# Patient Record
Sex: Female | Born: 1940 | Race: Black or African American | Hispanic: No | Marital: Married | Smoking: Never smoker
Health system: Southern US, Community
[De-identification: ages and names within clinical notes are randomized; demographics above are authoritative.]

## PROBLEM LIST (undated history)

## (undated) DIAGNOSIS — R0602 Shortness of breath: Secondary | ICD-10-CM

## (undated) DIAGNOSIS — J84113 Idiopathic non-specific interstitial pneumonitis: Secondary | ICD-10-CM

## (undated) DIAGNOSIS — Z6841 Body Mass Index (BMI) 40.0 and over, adult: Secondary | ICD-10-CM

## (undated) DIAGNOSIS — Z1211 Encounter for screening for malignant neoplasm of colon: Secondary | ICD-10-CM

## (undated) DIAGNOSIS — E785 Hyperlipidemia, unspecified: Secondary | ICD-10-CM

## (undated) DIAGNOSIS — I2699 Other pulmonary embolism without acute cor pulmonale: Secondary | ICD-10-CM

## (undated) DIAGNOSIS — I10 Essential (primary) hypertension: Secondary | ICD-10-CM

## (undated) DIAGNOSIS — M1990 Unspecified osteoarthritis, unspecified site: Secondary | ICD-10-CM

## (undated) DIAGNOSIS — Z79899 Other long term (current) drug therapy: Secondary | ICD-10-CM

## (undated) DIAGNOSIS — I890 Lymphedema, not elsewhere classified: Secondary | ICD-10-CM

## (undated) DIAGNOSIS — E7800 Pure hypercholesterolemia, unspecified: Secondary | ICD-10-CM

## (undated) DIAGNOSIS — M810 Age-related osteoporosis without current pathological fracture: Secondary | ICD-10-CM

## (undated) DIAGNOSIS — Z7901 Long term (current) use of anticoagulants: Secondary | ICD-10-CM

## (undated) DIAGNOSIS — R928 Other abnormal and inconclusive findings on diagnostic imaging of breast: Secondary | ICD-10-CM

## (undated) DIAGNOSIS — K219 Gastro-esophageal reflux disease without esophagitis: Secondary | ICD-10-CM

## (undated) DIAGNOSIS — D126 Benign neoplasm of colon, unspecified: Secondary | ICD-10-CM

## (undated) DIAGNOSIS — E559 Vitamin D deficiency, unspecified: Secondary | ICD-10-CM

## (undated) DIAGNOSIS — M069 Rheumatoid arthritis, unspecified: Secondary | ICD-10-CM

## (undated) DIAGNOSIS — I2510 Atherosclerotic heart disease of native coronary artery without angina pectoris: Secondary | ICD-10-CM

## (undated) DIAGNOSIS — R52 Pain, unspecified: Secondary | ICD-10-CM

## (undated) DIAGNOSIS — Z1239 Encounter for other screening for malignant neoplasm of breast: Secondary | ICD-10-CM

## (undated) DIAGNOSIS — R911 Solitary pulmonary nodule: Secondary | ICD-10-CM

## (undated) DIAGNOSIS — D638 Anemia in other chronic diseases classified elsewhere: Secondary | ICD-10-CM

## (undated) DIAGNOSIS — R079 Chest pain, unspecified: Secondary | ICD-10-CM

## (undated) HISTORY — PX: CARDIAC CATHETERIZATION: SHX172

## (undated) HISTORY — PX: TOE SURGERY: SHX1073

## (undated) HISTORY — PX: CARPAL TUNNEL RELEASE: SHX101

---

## 2010-07-18 DEATH — deceased

## 2016-11-30 ENCOUNTER — Emergency Department (HOSPITAL_COMMUNITY)
Admission: EM | Admit: 2016-11-30 | Discharge: 2016-12-01 | Disposition: A | Discharge status: Home / Self Care | Payer: Medicare Other | Attending: Emergency Medicine | Admitting: Emergency Medicine

## 2016-11-30 ENCOUNTER — Encounter (HOSPITAL_COMMUNITY): Payer: Self-pay | Admitting: Family Medicine

## 2016-11-30 DIAGNOSIS — Y999 Unspecified external cause status: Secondary | ICD-10-CM | POA: Diagnosis not present

## 2016-11-30 DIAGNOSIS — W010XXA Fall on same level from slipping, tripping and stumbling without subsequent striking against object, initial encounter: Secondary | ICD-10-CM | POA: Diagnosis not present

## 2016-11-30 DIAGNOSIS — W19XXXA Unspecified fall, initial encounter: Secondary | ICD-10-CM

## 2016-11-30 DIAGNOSIS — S50311A Abrasion of right elbow, initial encounter: Secondary | ICD-10-CM | POA: Insufficient documentation

## 2016-11-30 DIAGNOSIS — S60312A Abrasion of left thumb, initial encounter: Secondary | ICD-10-CM | POA: Insufficient documentation

## 2016-11-30 DIAGNOSIS — M25561 Pain in right knee: Secondary | ICD-10-CM | POA: Diagnosis not present

## 2016-11-30 DIAGNOSIS — S80911A Unspecified superficial injury of right knee, initial encounter: Secondary | ICD-10-CM | POA: Diagnosis present

## 2016-11-30 DIAGNOSIS — Y92007 Garden or yard of unspecified non-institutional (private) residence as the place of occurrence of the external cause: Secondary | ICD-10-CM | POA: Insufficient documentation

## 2016-11-30 DIAGNOSIS — I10 Essential (primary) hypertension: Secondary | ICD-10-CM | POA: Insufficient documentation

## 2016-11-30 DIAGNOSIS — Y9389 Activity, other specified: Secondary | ICD-10-CM | POA: Diagnosis not present

## 2016-11-30 HISTORY — DX: Other pulmonary embolism without acute cor pulmonale: I26.99

## 2016-11-30 HISTORY — DX: Essential (primary) hypertension: I10

## 2016-11-30 HISTORY — DX: Anemia in other chronic diseases classified elsewhere: D63.8

## 2016-11-30 HISTORY — DX: Benign neoplasm of colon, unspecified: D12.6

## 2016-11-30 HISTORY — DX: Other abnormal and inconclusive findings on diagnostic imaging of breast: R92.8

## 2016-11-30 HISTORY — DX: Hyperlipidemia, unspecified: E78.5

## 2016-11-30 HISTORY — DX: Rheumatoid arthritis, unspecified: M06.9

## 2016-11-30 HISTORY — DX: Atherosclerotic heart disease of native coronary artery without angina pectoris: I25.10

## 2016-11-30 HISTORY — DX: Lymphedema, not elsewhere classified: I89.0

## 2016-11-30 HISTORY — DX: Body Mass Index (BMI) 40.0 and over, adult: Z684

## 2016-11-30 HISTORY — DX: Unspecified osteoarthritis, unspecified site: M19.90

## 2016-11-30 HISTORY — DX: Age-related osteoporosis without current pathological fracture: M81.0

## 2016-11-30 HISTORY — DX: Shortness of breath: R06.02

## 2016-11-30 HISTORY — DX: Idiopathic non-specific interstitial pneumonitis: J84.113

## 2016-11-30 HISTORY — DX: Morbid (severe) obesity due to excess calories: E66.01

## 2016-11-30 HISTORY — DX: Pure hypercholesterolemia, unspecified: E78.00

## 2016-11-30 HISTORY — DX: Encounter for screening for malignant neoplasm of colon: Z12.11

## 2016-11-30 HISTORY — DX: Gastro-esophageal reflux disease without esophagitis: K21.9

## 2016-11-30 HISTORY — DX: Chest pain, unspecified: R07.9

## 2016-11-30 HISTORY — DX: Pain, unspecified: R52

## 2016-11-30 HISTORY — DX: Encounter for other screening for malignant neoplasm of breast: Z12.39

## 2016-11-30 HISTORY — DX: Vitamin D deficiency, unspecified: E55.9

## 2016-11-30 HISTORY — DX: Other long term (current) drug therapy: Z79.899

## 2016-11-30 HISTORY — DX: Solitary pulmonary nodule: R91.1

## 2016-11-30 HISTORY — DX: Long term (current) use of anticoagulants: Z79.01

## 2016-11-30 NOTE — ED Notes (Signed)
Bed: WTR5 Expected date:  Expected time:  Means of arrival:  Comments: 

## 2016-11-30 NOTE — ED Triage Notes (Signed)
Patient reports she fell today by loosing her balance. Patient is complaining of left thumb, right elbow, and right leg pain.

## 2016-12-01 ENCOUNTER — Emergency Department (HOSPITAL_COMMUNITY): Payer: Medicare Other

## 2016-12-01 DIAGNOSIS — S50311A Abrasion of right elbow, initial encounter: Secondary | ICD-10-CM | POA: Diagnosis not present

## 2016-12-01 MED ORDER — BACITRACIN ZINC 500 UNIT/GM EX OINT
TOPICAL_OINTMENT | CUTANEOUS | Status: AC
  Filled 2016-12-01: qty 0.9

## 2016-12-01 NOTE — ED Notes (Signed)
Bed: WTR7 Expected date:  Expected time:  Means of arrival:  Comments: 

## 2016-12-01 NOTE — ED Provider Notes (Signed)
[place] DEPT Provider Note   CSN: [id number] Arrival date & time: 11/30/16  2140     History   Chief Complaint Chief Complaint  Patient presents with  . Fall    HPI Carolyn Mills is a 76 y.o. female.  Patient presents to the ED with a chief complaint of fall.  She states that she was taking out the trash and stumbled and fell landing on her right knee, right elbow, and left hand.  She did not hit her head or pass out.  She states that her knee hurts the most. She has not taken anything for her pain.  She has been ambulatory.  She denies any other symptoms.  Denies having felt dizzy or lightheaded prior to falling.     The history is provided by the patient. No language interpreter was used.    Past Medical History:  Diagnosis Date  . Abnormal findings on diagnostic imaging of breast   . Anemia of chronic disease   . Arthritis   . Body mass index 40.0-44.9, adult ([place])   . Burning pain   . Chest pain   . Encounter for screening for malignant neoplasm of breast   . GERD (gastroesophageal reflux disease)   . HLD (hyperlipidemia)   . HTN (hypertension)   . Hyperlipemia   . 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 Long term (current) use of anticoagulants   . Long term use of drug   . Lymphedema   . Mild coronary artery disease   . Morbid obesity due to excess calories ([place])   . Osteoporosis   . Pulmonary embolism ([place])   . Pure hypercholesterolemia   . Rheumatoid arthritis ([place])   . Screen for colon cancer   . Shortness of breath   . Solitary lung nodule   . Tubular adenoma of colon   . Vitamin D deficiency     There are no active problems to display for this patient.   Past Surgical History:  Procedure Laterality Date  . CARDIAC CATHETERIZATION    . CARPAL TUNNEL RELEASE Right   . TOE SURGERY Bilateral     OB History    No data available       Home Medications    Prior to Admission medications   Not on File    Family  History History reviewed. No pertinent family history.  Social History Social History  Substance Use Topics  . Smoking status: Never Smoker  . Smokeless tobacco: Never Used  . Alcohol use No     Allergies   Patient has no allergy information on record.   Review of Systems Review of Systems  All other systems reviewed and are negative.    Physical Exam Updated Vital Signs BP (!) 148/57 (BP Location: Left Arm)   Pulse 76   Temp 98.3 F (36.8 C) (Oral)   Resp 20   Ht 5\' 3"  (1.6 m)   Wt 106.6 kg (235 lb)   SpO2 98%   BMI 41.63 kg/m   Physical Exam  Constitutional: She is oriented to person, place, and time. She appears well-developed and well-nourished.  HENT:  Head: Normocephalic and atraumatic.  Eyes: Pupils are equal, round, and reactive to light. Conjunctivae and EOM are normal.  Neck: Normal range of motion. Neck supple.  Cardiovascular: Normal rate and regular rhythm.  Exam reveals no gallop and no friction rub.   No murmur heard. Pulmonary/Chest: Effort normal and breath sounds normal. No respiratory distress. She  has no wheezes. She has no rales. She exhibits no tenderness.  Abdominal: Soft. Bowel sounds are normal. She exhibits no distension and no mass. There is no tenderness. There is no rebound and no guarding.  Musculoskeletal: Normal range of motion. She exhibits no edema or tenderness.  Moves all extremities Ambulates with cane  Neurological: She is alert and oriented to person, place, and time.  Skin: Skin is warm and dry.  Minor abrasions to left thumb and right elbow  Psychiatric: She has a normal mood and affect. Her behavior is normal. Judgment and thought content normal.  Nursing note and vitals reviewed.    ED Treatments / Results  Labs (all labs ordered are listed, but only abnormal results are displayed) Labs Reviewed - No data to display  EKG  EKG Interpretation None       Radiology No results found.  Procedures Procedures  (including critical care time)  Medications Ordered in ED Medications - No data to display   Initial Impression / Assessment and Plan / ED Course  I have reviewed the triage vital signs and the nursing notes.  Pertinent labs & imaging results that were available during my care of the patient were reviewed by me and considered in my medical decision making (see chart for details).      Patient with mechanical fall. Patient X-Ray negative for obvious fracture or dislocation.  Pt advised to follow up with orthopedics. Patient will be discharged home & is agreeable with above plan. Returns precautions discussed. Pt appears safe for discharge.    Final Clinical Impressions(s) / ED Diagnoses   Final diagnoses:  Fall, initial encounter    New Prescriptions New Prescriptions   No medications on file     Montine Circle, Hershal Coria 12/01/16 [place], [place], MD 12/01/16 [phone]

## 2018-02-15 ENCOUNTER — Emergency Department (HOSPITAL_COMMUNITY): Payer: Medicare Other

## 2018-02-15 ENCOUNTER — Encounter (HOSPITAL_COMMUNITY): Payer: Self-pay | Admitting: Emergency Medicine

## 2018-02-15 ENCOUNTER — Other Ambulatory Visit: Payer: Self-pay

## 2018-02-15 ENCOUNTER — Inpatient Hospital Stay (HOSPITAL_COMMUNITY)
Admission: EM | Admit: 2018-02-15 | Discharge: 2018-02-18 | DRG: 871 | Disposition: A | Discharge status: Home / Self Care | Payer: Medicare Other | Attending: Internal Medicine | Admitting: Internal Medicine

## 2018-02-15 DIAGNOSIS — I248 Other forms of acute ischemic heart disease: Secondary | ICD-10-CM | POA: Diagnosis present

## 2018-02-15 DIAGNOSIS — I5033 Acute on chronic diastolic (congestive) heart failure: Secondary | ICD-10-CM | POA: Diagnosis present

## 2018-02-15 DIAGNOSIS — I959 Hypotension, unspecified: Secondary | ICD-10-CM | POA: Diagnosis present

## 2018-02-15 DIAGNOSIS — E7800 Pure hypercholesterolemia, unspecified: Secondary | ICD-10-CM | POA: Diagnosis present

## 2018-02-15 DIAGNOSIS — M1990 Unspecified osteoarthritis, unspecified site: Secondary | ICD-10-CM | POA: Diagnosis not present

## 2018-02-15 DIAGNOSIS — Z6838 Body mass index (BMI) 38.0-38.9, adult: Secondary | ICD-10-CM | POA: Diagnosis not present

## 2018-02-15 DIAGNOSIS — J189 Pneumonia, unspecified organism: Secondary | ICD-10-CM

## 2018-02-15 DIAGNOSIS — I10 Essential (primary) hypertension: Secondary | ICD-10-CM

## 2018-02-15 DIAGNOSIS — R509 Fever, unspecified: Secondary | ICD-10-CM | POA: Diagnosis not present

## 2018-02-15 DIAGNOSIS — Z888 Allergy status to other drugs, medicaments and biological substances status: Secondary | ICD-10-CM | POA: Diagnosis not present

## 2018-02-15 DIAGNOSIS — I509 Heart failure, unspecified: Secondary | ICD-10-CM

## 2018-02-15 DIAGNOSIS — Z79899 Other long term (current) drug therapy: Secondary | ICD-10-CM | POA: Diagnosis not present

## 2018-02-15 DIAGNOSIS — N179 Acute kidney failure, unspecified: Secondary | ICD-10-CM

## 2018-02-15 DIAGNOSIS — I110 Hypertensive heart disease with heart failure: Secondary | ICD-10-CM | POA: Diagnosis present

## 2018-02-15 DIAGNOSIS — Z7901 Long term (current) use of anticoagulants: Secondary | ICD-10-CM | POA: Diagnosis not present

## 2018-02-15 DIAGNOSIS — I272 Pulmonary hypertension, unspecified: Secondary | ICD-10-CM | POA: Diagnosis present

## 2018-02-15 DIAGNOSIS — A419 Sepsis, unspecified organism: Secondary | ICD-10-CM | POA: Diagnosis present

## 2018-02-15 DIAGNOSIS — Z881 Allergy status to other antibiotic agents status: Secondary | ICD-10-CM

## 2018-02-15 DIAGNOSIS — R778 Other specified abnormalities of plasma proteins: Secondary | ICD-10-CM

## 2018-02-15 DIAGNOSIS — E876 Hypokalemia: Secondary | ICD-10-CM | POA: Diagnosis not present

## 2018-02-15 DIAGNOSIS — M810 Age-related osteoporosis without current pathological fracture: Secondary | ICD-10-CM | POA: Diagnosis present

## 2018-02-15 DIAGNOSIS — J45909 Unspecified asthma, uncomplicated: Secondary | ICD-10-CM | POA: Diagnosis present

## 2018-02-15 DIAGNOSIS — I824Y9 Acute embolism and thrombosis of unspecified deep veins of unspecified proximal lower extremity: Secondary | ICD-10-CM

## 2018-02-15 DIAGNOSIS — J8410 Pulmonary fibrosis, unspecified: Secondary | ICD-10-CM | POA: Diagnosis present

## 2018-02-15 DIAGNOSIS — I890 Lymphedema, not elsewhere classified: Secondary | ICD-10-CM | POA: Diagnosis present

## 2018-02-15 DIAGNOSIS — D638 Anemia in other chronic diseases classified elsewhere: Secondary | ICD-10-CM | POA: Diagnosis present

## 2018-02-15 DIAGNOSIS — K219 Gastro-esophageal reflux disease without esophagitis: Secondary | ICD-10-CM | POA: Diagnosis present

## 2018-02-15 DIAGNOSIS — D649 Anemia, unspecified: Secondary | ICD-10-CM

## 2018-02-15 DIAGNOSIS — Z86718 Personal history of other venous thrombosis and embolism: Secondary | ICD-10-CM

## 2018-02-15 DIAGNOSIS — M069 Rheumatoid arthritis, unspecified: Secondary | ICD-10-CM | POA: Diagnosis present

## 2018-02-15 DIAGNOSIS — I2510 Atherosclerotic heart disease of native coronary artery without angina pectoris: Secondary | ICD-10-CM | POA: Diagnosis present

## 2018-02-15 DIAGNOSIS — R0602 Shortness of breath: Secondary | ICD-10-CM

## 2018-02-15 DIAGNOSIS — J181 Lobar pneumonia, unspecified organism: Secondary | ICD-10-CM | POA: Diagnosis present

## 2018-02-15 DIAGNOSIS — Z86711 Personal history of pulmonary embolism: Secondary | ICD-10-CM | POA: Diagnosis not present

## 2018-02-15 DIAGNOSIS — Z882 Allergy status to sulfonamides status: Secondary | ICD-10-CM

## 2018-02-15 DIAGNOSIS — R7989 Other specified abnormal findings of blood chemistry: Secondary | ICD-10-CM

## 2018-02-15 LAB — CBC WITH DIFFERENTIAL/PLATELET
Abs Immature Granulocytes: 0.08 10*3/uL — ABNORMAL HIGH (ref 0.00–0.07)
Basophils Absolute: 0 10*3/uL (ref 0.0–0.1)
Basophils Relative: 0 %
EOS PCT: 0 %
Eosinophils Absolute: 0 10*3/uL (ref 0.0–0.5)
HEMATOCRIT: 29.9 % — AB (ref 36.0–46.0)
HEMOGLOBIN: 9.1 g/dL — AB (ref 12.0–15.0)
Immature Granulocytes: 1 %
LYMPHS ABS: 0.6 10*3/uL — AB (ref 0.7–4.0)
LYMPHS PCT: 4 %
MCH: 25.9 pg — AB (ref 26.0–34.0)
MCHC: 30.4 g/dL (ref 30.0–36.0)
MCV: 85.2 fL (ref 80.0–100.0)
MONO ABS: 0.2 10*3/uL (ref 0.1–1.0)
Monocytes Relative: 1 %
Neutro Abs: 13.5 10*3/uL — ABNORMAL HIGH (ref 1.7–7.7)
Neutrophils Relative %: 94 %
Platelets: 319 10*3/uL (ref 150–400)
RBC: 3.51 MIL/uL — ABNORMAL LOW (ref 3.87–5.11)
RDW: 15.8 % — ABNORMAL HIGH (ref 11.5–15.5)
WBC: 14.4 10*3/uL — ABNORMAL HIGH (ref 4.0–10.5)
nRBC: 0 % (ref 0.0–0.2)

## 2018-02-15 LAB — RESPIRATORY PANEL BY PCR
Adenovirus: NOT DETECTED
BORDETELLA PERTUSSIS-RVPCR: NOT DETECTED
CORONAVIRUS 229E-RVPPCR: NOT DETECTED
CORONAVIRUS HKU1-RVPPCR: NOT DETECTED
Chlamydophila pneumoniae: NOT DETECTED
Coronavirus NL63: NOT DETECTED
Coronavirus OC43: NOT DETECTED
INFLUENZA B-RVPPCR: NOT DETECTED
Influenza A: NOT DETECTED
METAPNEUMOVIRUS-RVPPCR: NOT DETECTED
MYCOPLASMA PNEUMONIAE-RVPPCR: NOT DETECTED
PARAINFLUENZA VIRUS 2-RVPPCR: NOT DETECTED
Parainfluenza Virus 1: NOT DETECTED
Parainfluenza Virus 3: NOT DETECTED
Parainfluenza Virus 4: NOT DETECTED
RESPIRATORY SYNCYTIAL VIRUS-RVPPCR: NOT DETECTED
Rhinovirus / Enterovirus: NOT DETECTED

## 2018-02-15 LAB — MAGNESIUM: Magnesium: 1.9 mg/dL (ref 1.7–2.4)

## 2018-02-15 LAB — URINALYSIS, ROUTINE W REFLEX MICROSCOPIC
BILIRUBIN URINE: NEGATIVE
Glucose, UA: NEGATIVE mg/dL
Hgb urine dipstick: NEGATIVE
Ketones, ur: NEGATIVE mg/dL
Leukocytes, UA: NEGATIVE
NITRITE: NEGATIVE
PH: 5 (ref 5.0–8.0)
Protein, ur: NEGATIVE mg/dL
SPECIFIC GRAVITY, URINE: 1.004 — AB (ref 1.005–1.030)

## 2018-02-15 LAB — COMPREHENSIVE METABOLIC PANEL
ALBUMIN: 3.2 g/dL — AB (ref 3.5–5.0)
ALT: 14 U/L (ref 0–44)
AST: 34 U/L (ref 15–41)
Alkaline Phosphatase: 48 U/L (ref 38–126)
Anion gap: 9 (ref 5–15)
BUN: 23 mg/dL (ref 8–23)
CHLORIDE: 103 mmol/L (ref 98–111)
CO2: 25 mmol/L (ref 22–32)
CREATININE: 1.52 mg/dL — AB (ref 0.44–1.00)
Calcium: 8.9 mg/dL (ref 8.9–10.3)
GFR calc Af Amer: 37 mL/min — ABNORMAL LOW (ref >60)
GFR calc non Af Amer: 32 mL/min — ABNORMAL LOW (ref >60)
GLUCOSE: 99 mg/dL (ref 70–99)
Potassium: 4.5 mmol/L (ref 3.5–5.1)
SODIUM: 137 mmol/L (ref 135–145)
Total Bilirubin: 1.5 mg/dL — ABNORMAL HIGH (ref 0.3–1.2)
Total Protein: 7.5 g/dL (ref 6.5–8.1)

## 2018-02-15 LAB — I-STAT CG4 LACTIC ACID, ED: LACTIC ACID, VENOUS: 1.78 mmol/L (ref 0.5–1.9)

## 2018-02-15 LAB — INFLUENZA PANEL BY PCR (TYPE A & B)
INFLAPCR: NEGATIVE
INFLBPCR: NEGATIVE

## 2018-02-15 LAB — TROPONIN I
TROPONIN I: 0.08 ng/mL — AB (ref <0.03)
TROPONIN I: 0.38 ng/mL — AB (ref <0.03)

## 2018-02-15 LAB — BRAIN NATRIURETIC PEPTIDE: B Natriuretic Peptide: 289 pg/mL — ABNORMAL HIGH (ref 0.0–100.0)

## 2018-02-15 MED ORDER — ONDANSETRON HCL 4 MG/2ML IJ SOLN
4.0000 mg | Freq: Four times a day (QID) | INTRAMUSCULAR | Status: DC | PRN
  Administered 2018-02-15: 4 mg via INTRAVENOUS
  Filled 2018-02-15: qty 2

## 2018-02-15 MED ORDER — LIP MEDEX EX OINT
TOPICAL_OINTMENT | CUTANEOUS | Status: DC | PRN
  Filled 2018-02-15: qty 7

## 2018-02-15 MED ORDER — PREDNISONE 5 MG PO TABS
5.0000 mg | ORAL_TABLET | ORAL | Status: DC
  Administered 2018-02-15 – 2018-02-17 (×2): 5 mg via ORAL
  Filled 2018-02-15 (×3): qty 1

## 2018-02-15 MED ORDER — POLYETHYLENE GLYCOL 3350 17 G PO PACK: 17.0000 g | PACK | Freq: Every day | ORAL | Status: DC | PRN

## 2018-02-15 MED ORDER — MAGNESIUM SULFATE 2 GM/50ML IV SOLN
2.0000 g | Freq: Once | INTRAVENOUS | Status: AC
  Administered 2018-02-15: 2 g via INTRAVENOUS
  Filled 2018-02-15: qty 50

## 2018-02-15 MED ORDER — ACETAMINOPHEN 325 MG PO TABS
650.0000 mg | ORAL_TABLET | Freq: Once | ORAL | Status: AC
  Administered 2018-02-15: 650 mg via ORAL
  Filled 2018-02-15: qty 2

## 2018-02-15 MED ORDER — VANCOMYCIN HCL IN DEXTROSE 1-5 GM/200ML-% IV SOLN: 1000.0000 mg | Freq: Once | INTRAVENOUS | Status: DC

## 2018-02-15 MED ORDER — NITROGLYCERIN 0.4 MG SL SUBL: 0.4000 mg | SUBLINGUAL_TABLET | SUBLINGUAL | Status: DC | PRN

## 2018-02-15 MED ORDER — APIXABAN 5 MG PO TABS
5.0000 mg | ORAL_TABLET | Freq: Two times a day (BID) | ORAL | Status: DC
  Administered 2018-02-15 – 2018-02-18 (×7): 5 mg via ORAL
  Filled 2018-02-15 (×7): qty 1

## 2018-02-15 MED ORDER — PANTOPRAZOLE SODIUM 40 MG PO TBEC
40.0000 mg | DELAYED_RELEASE_TABLET | Freq: Two times a day (BID) | ORAL | Status: DC
  Administered 2018-02-15 – 2018-02-18 (×7): 40 mg via ORAL
  Filled 2018-02-15 (×7): qty 1

## 2018-02-15 MED ORDER — VITAMIN B-12 1000 MCG PO TABS
1000.0000 ug | ORAL_TABLET | Freq: Every day | ORAL | Status: DC
  Administered 2018-02-15 – 2018-02-18 (×4): 1000 ug via ORAL
  Filled 2018-02-15 (×4): qty 1

## 2018-02-15 MED ORDER — METHOTREXATE 2.5 MG PO TABS
15.0000 mg | ORAL_TABLET | ORAL | Status: DC
  Filled 2018-02-15: qty 6

## 2018-02-15 MED ORDER — FUROSEMIDE 10 MG/ML IJ SOLN
40.0000 mg | Freq: Once | INTRAMUSCULAR | Status: AC
  Administered 2018-02-15: 40 mg via INTRAVENOUS
  Filled 2018-02-15: qty 4

## 2018-02-15 MED ORDER — FUROSEMIDE 10 MG/ML IJ SOLN
40.0000 mg | Freq: Two times a day (BID) | INTRAMUSCULAR | Status: AC
  Administered 2018-02-15 – 2018-02-16 (×3): 40 mg via INTRAVENOUS
  Filled 2018-02-15 (×3): qty 4

## 2018-02-15 MED ORDER — OMEGA-3-ACID ETHYL ESTERS 1 G PO CAPS
1.0000 g | ORAL_CAPSULE | Freq: Two times a day (BID) | ORAL | Status: DC
  Administered 2018-02-15 – 2018-02-18 (×7): 1 g via ORAL
  Filled 2018-02-15 (×7): qty 1

## 2018-02-15 MED ORDER — VITAMIN D 1000 UNITS PO TABS
2000.0000 [IU] | ORAL_TABLET | Freq: Every day | ORAL | Status: DC
  Administered 2018-02-15 – 2018-02-18 (×4): 2000 [IU] via ORAL
  Filled 2018-02-15 (×4): qty 2

## 2018-02-15 MED ORDER — METRONIDAZOLE IN NACL 5-0.79 MG/ML-% IV SOLN
500.0000 mg | Freq: Three times a day (TID) | INTRAVENOUS | Status: DC
  Administered 2018-02-15 (×2): 500 mg via INTRAVENOUS
  Filled 2018-02-15 (×2): qty 100

## 2018-02-15 MED ORDER — CEFEPIME HCL 2 G IJ SOLR
2.0000 g | Freq: Once | INTRAMUSCULAR | Status: AC
  Administered 2018-02-15: 2 g via INTRAVENOUS
  Filled 2018-02-15: qty 2

## 2018-02-15 MED ORDER — METOLAZONE 2.5 MG PO TABS
2.5000 mg | ORAL_TABLET | Freq: Every day | ORAL | Status: DC
  Administered 2018-02-15 – 2018-02-18 (×4): 2.5 mg via ORAL
  Filled 2018-02-15 (×4): qty 1

## 2018-02-15 MED ORDER — SODIUM CHLORIDE 0.9 % IV SOLN
1.0000 g | INTRAVENOUS | Status: DC
  Administered 2018-02-15 – 2018-02-16 (×2): 1 g via INTRAVENOUS
  Filled 2018-02-15 (×3): qty 10

## 2018-02-15 MED ORDER — IPRATROPIUM-ALBUTEROL 0.5-2.5 (3) MG/3ML IN SOLN
3.0000 mL | Freq: Once | RESPIRATORY_TRACT | Status: AC
  Administered 2018-02-15: 3 mL via RESPIRATORY_TRACT
  Filled 2018-02-15: qty 3

## 2018-02-15 MED ORDER — GABAPENTIN 300 MG PO CAPS
300.0000 mg | ORAL_CAPSULE | Freq: Three times a day (TID) | ORAL | Status: DC
  Administered 2018-02-15 – 2018-02-18 (×10): 300 mg via ORAL
  Filled 2018-02-15 (×10): qty 1

## 2018-02-15 MED ORDER — VANCOMYCIN HCL 10 G IV SOLR
2000.0000 mg | Freq: Once | INTRAVENOUS | Status: AC
  Administered 2018-02-15: 2000 mg via INTRAVENOUS
  Filled 2018-02-15: qty 2000

## 2018-02-15 MED ORDER — FOLIC ACID 1 MG PO TABS
1.0000 mg | ORAL_TABLET | Freq: Every day | ORAL | Status: DC
  Administered 2018-02-15 – 2018-02-18 (×4): 1 mg via ORAL
  Filled 2018-02-15 (×4): qty 1

## 2018-02-15 MED ORDER — FUROSEMIDE 10 MG/ML IJ SOLN: 40.0000 mg | Freq: Two times a day (BID) | INTRAMUSCULAR | Status: DC

## 2018-02-15 MED ORDER — METOPROLOL TARTRATE 25 MG PO TABS
25.0000 mg | ORAL_TABLET | Freq: Two times a day (BID) | ORAL | Status: DC
  Administered 2018-02-15 – 2018-02-18 (×5): 25 mg via ORAL
  Filled 2018-02-15 (×7): qty 1

## 2018-02-15 MED ORDER — POTASSIUM CHLORIDE CRYS ER 20 MEQ PO TBCR
20.0000 meq | EXTENDED_RELEASE_TABLET | Freq: Every day | ORAL | Status: DC
  Administered 2018-02-15 – 2018-02-18 (×4): 20 meq via ORAL
  Filled 2018-02-15 (×4): qty 1

## 2018-02-15 MED ORDER — ISOSORBIDE MONONITRATE ER 30 MG PO TB24: 30.0000 mg | ORAL_TABLET | Freq: Every day | ORAL | Status: DC

## 2018-02-15 MED ORDER — ROSUVASTATIN CALCIUM 20 MG PO TABS
20.0000 mg | ORAL_TABLET | Freq: Every day | ORAL | Status: DC
  Administered 2018-02-15 – 2018-02-17 (×3): 20 mg via ORAL
  Filled 2018-02-15 (×3): qty 1

## 2018-02-15 MED ORDER — FUROSEMIDE 10 MG/ML IJ SOLN
20.0000 mg | Freq: Two times a day (BID) | INTRAMUSCULAR | Status: DC
  Administered 2018-02-15: 20 mg via INTRAVENOUS
  Filled 2018-02-15 (×2): qty 2

## 2018-02-15 MED ORDER — HYDROXYCHLOROQUINE SULFATE 200 MG PO TABS
200.0000 mg | ORAL_TABLET | Freq: Two times a day (BID) | ORAL | Status: DC
  Administered 2018-02-15 – 2018-02-18 (×7): 200 mg via ORAL
  Filled 2018-02-15 (×7): qty 1

## 2018-02-15 MED ORDER — ADULT MULTIVITAMIN W/MINERALS CH
1.0000 | ORAL_TABLET | Freq: Every day | ORAL | Status: DC
  Administered 2018-02-15 – 2018-02-18 (×4): 1 via ORAL
  Filled 2018-02-15 (×4): qty 1

## 2018-02-15 NOTE — ED Provider Notes (Signed)
[place] EMERGENCY DEPARTMENT Provider Note   CSN: [id number] Arrival date & time: 02/15/18  0355     History   Chief Complaint Chief Complaint  Patient presents with  . Shortness of Breath    HPI Carolyn Mills is a 77 y.o. female.  The history is provided by the patient.  She was brought in by ambulance because of difficulty breathing tonight.  She has history of hypertension, hyperlipidemia, coronary artery disease, pulmonary embolism, diastolic heart failure, chronic anticoagulation with apixaban.  She denies chest pain, heaviness, tightness, pressure.  She states that she frequently has dyspnea, but it got significantly worse tonight.  She has had some chills but was not aware of fever.  She had not broken out in a sweat.  There was no nausea or vomiting.  She has had a cough productive of small amount of clear sputum.  She has had no known sick contacts.  EMS gave her magnesium and albuterol with significant improvement in breathing.  They also noted temperature of 104 degrees.  Past Medical History:  Diagnosis Date  . Abnormal findings on diagnostic imaging of breast   . Anemia of chronic disease   . Arthritis   . Body mass index 40.0-44.9, adult ([place])   . Burning pain   . Chest pain   . Encounter for screening for malignant neoplasm of breast   . GERD (gastroesophageal reflux disease)   . HLD (hyperlipidemia)   . HTN (hypertension)   . Hyperlipemia   . 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 Long term (current) use of anticoagulants   . Long term use of drug   . Lymphedema   . Mild coronary artery disease   . Morbid obesity due to excess calories ([place])   . Osteoporosis   . Pulmonary embolism ([place])   . Pure hypercholesterolemia   . Rheumatoid arthritis ([place])   . Screen for colon cancer   . Shortness of breath   . Solitary lung nodule   . Tubular adenoma of colon   . Vitamin D deficiency     There are no active  problems to display for this patient.   Past Surgical History:  Procedure Laterality Date  . CARDIAC CATHETERIZATION    . CARPAL TUNNEL RELEASE Right   . TOE SURGERY Bilateral      OB History   None      Home Medications    Prior to Admission medications   Not on File    Family History No family history on file.  Social History Social History   Tobacco Use  . Smoking status: Never Smoker  . Smokeless tobacco: Never Used  Substance Use Topics  . Alcohol use: No  . Drug use: No     Allergies   Patient has no allergy information on record.   Review of Systems Review of Systems  All other systems reviewed and are negative.    Physical Exam Updated Vital Signs BP (!) 116/52   Pulse (!) 102   Temp (!) 102.9 F (39.4 C) (Rectal)   Resp (!) 23   Ht 5\' 3"  (1.6 m)   Wt 105.2 kg   SpO2 94%   BMI 41.10 kg/m   Physical Exam  Nursing note and vitals reviewed.  77 year old female, resting comfortably and in no acute distress. Vital signs are significant for fever, tachycardia, tachypnea. Oxygen saturation is 94%, which is normal. Head is normocephalic and atraumatic. PERRLA, EOMI. Oropharynx  is clear. Neck is nontender and supple without adenopathy or JVD. Back is nontender and there is no CVA tenderness. Lungs have bibasilar rales and few scattered expiratory wheezes.  There are no rhonchi. Chest is nontender. Heart has regular rate and rhythm without murmur. Abdomen is soft, flat, nontender without masses or hepatosplenomegaly and peristalsis is normoactive. Extremities have 3+ edema with moderate venous stasis changes, full range of motion is present. Skin is warm and dry without rash. Neurologic: Mental status is normal, cranial nerves are intact, there are no motor or sensory deficits.  ED Treatments / Results  Labs (all labs ordered are listed, but only abnormal results are displayed) Labs Reviewed  COMPREHENSIVE METABOLIC PANEL - Abnormal;  Notable for the following components:      Result Value   Creatinine, Ser 1.52 (*)    Albumin 3.2 (*)    Total Bilirubin 1.5 (*)    GFR calc non Af Amer 32 (*)    GFR calc Af Amer 37 (*)    All other components within normal limits  CBC WITH DIFFERENTIAL/PLATELET - Abnormal; Notable for the following components:   WBC 14.4 (*)    RBC 3.51 (*)    Hemoglobin 9.1 (*)    HCT 29.9 (*)    MCH 25.9 (*)    RDW 15.8 (*)    Neutro Abs 13.5 (*)    Lymphs Abs 0.6 (*)    Abs Immature Granulocytes 0.08 (*)    All other components within normal limits  BRAIN NATRIURETIC PEPTIDE - Abnormal; Notable for the following components:   B Natriuretic Peptide 289.0 (*)    All other components within normal limits  TROPONIN I - Abnormal; Notable for the following components:   Troponin I 0.08 (*)    All other components within normal limits  CULTURE, BLOOD (ROUTINE X 2)  CULTURE, BLOOD (ROUTINE X 2)  URINALYSIS, ROUTINE W REFLEX MICROSCOPIC  TROPONIN I  INFLUENZA PANEL BY PCR (TYPE A & B)  I-STAT CG4 LACTIC ACID, ED    EKG EKG Interpretation  Date/Time:  Thursday February 15 2018 06:32:29 EDT Ventricular Rate:  92 PR Interval:    QRS Duration: 90 QT Interval:  345 QTC Calculation: 427 R Axis:   1 Text Interpretation:  Sinus rhythm Atrial premature complexes LVH with secondary repolarization abnormality No old tracing to compare Confirmed by Delora Fuel ([id number]) on 02/15/2018 6:37:24 AM   Radiology Dg Chest Port 1 View  Result Date: 02/15/2018 CLINICAL DATA:  77 year old female with shortness of breath. EXAM: PORTABLE CHEST 1 VIEW COMPARISON:  None. FINDINGS: There is mild cardiomegaly with mild vascular congestion. No focal consolidation, pleural effusion, or pneumothorax. Calcified mitral annulus and atherosclerotic calcification of the aortic arch. Osteopenia with degenerative changes of the spine. No acute osseous pathology. IMPRESSION: Cardiomegaly with mild vascular congestion. No focal  consolidation. Electronically Signed   By: Anner Crete M.D.   On: 02/15/2018 05:02    Procedures Procedures  CRITICAL CARE Performed by: Delora Fuel Total critical care time: 45 minutes Critical care time was exclusive of separately billable procedures and treating other patients. Critical care was necessary to treat or prevent imminent or life-threatening deterioration. Critical care was time spent personally by me on the following activities: development of treatment plan with patient and/or surrogate as well as nursing, discussions with consultants, evaluation of patient's response to treatment, examination of patient, obtaining history from patient or surrogate, ordering and performing treatments and interventions, ordering and review of laboratory studies,  ordering and review of radiographic studies, pulse oximetry and re-evaluation of patient's condition.  Medications Ordered in ED Medications  ipratropium-albuterol (DUONEB) 0.5-2.5 (3) MG/3ML nebulizer solution 3 mL (has no administration in time range)  ceFEPIme (MAXIPIME) 2 g in sodium chloride 0.9 % 100 mL IVPB (has no administration in time range)  metroNIDAZOLE (FLAGYL) IVPB 500 mg (has no administration in time range)  vancomycin (VANCOCIN) 2,000 mg in sodium chloride 0.9 % 500 mL IVPB (has no administration in time range)  acetaminophen (TYLENOL) tablet 650 mg (has no administration in time range)     Initial Impression / Assessment and Plan / ED Course  I have reviewed the triage vital signs and the nursing notes.  Pertinent labs & imaging results that were available during my care of the patient were reviewed by me and considered in my medical decision making (see chart for details).  Dyspnea with fever concerning for pneumonia.  She is started on sepsis pathway and is started on empiric antibiotics.  Family has arrived and are requesting that she be transferred to [facility] and states that that is where  they had asked the ambulance to go to.  Advised that since she is being started on sepsis pathway, it would be an appropriate to delay treatment to transfer.  Old records are reviewed, and she is followed by cardiologist in [place] hometown for diastolic heart failure.  Chest x-ray shows no evidence of pneumonia.  Troponin has come back borderline elevated and is felt to be related to demand ischemia as opposed to ACS.  BNP is come back mildly elevated and chest x-ray does show pulmonary vascular congestion, she is given a dose of furosemide.  Lactic acid has come back normal.  Creatinine is mildly elevated to 1.52, had been 1.08 on August 12.  Hemoglobin is 9.1 which is actually slightly higher than her baseline.  Case is discussed with Dr. Hal Hope of [facility], who agrees to admit the patient.  Final Clinical Impressions(s) / ED Diagnoses   Final diagnoses:  Shortness of breath  Fever, unspecified fever cause  Acute kidney injury (nontraumatic) (HCC)  Elevated troponin I level  Elevated brain natriuretic peptide (BNP) level  Normocytic anemia    ED Discharge Orders    None       Delora Fuel, MD 54/56/25 [phone]

## 2018-02-15 NOTE — ED Notes (Signed)
Paged Dr. Earnest Conroy regarding pt's elevated troponin

## 2018-02-15 NOTE — ED Notes (Signed)
Dr. Earnest Conroy aware of pt's elevated troponin

## 2018-02-15 NOTE — ED Notes (Signed)
PT daughter verbally aggressive with staff. Was informed that her mother was yet to be in room and would be a few minutes before they can go back. PT daughter proceeded to ambulance bay to see PT. Rn informed to let [facility] know when family can come back. When PT daughter returned to lobby she demanded I let her back to take her mother to another hospital continued cursing and arguing with staff. RN called to obtain PTs opinion. PT wanted to speak with daughter. PT daughter escorted to room.

## 2018-02-15 NOTE — ED Triage Notes (Signed)
Per ems pt was at home and woke up to go to bathroom and felt sob. Pt is warm to the touch. Pt has wheezing in both lungs. Pt was given 2 gr. of magnesium and 10 albuterol and 1 of Atrovent 125 solumedrol hx copd and chf. Pt alert orient x 4.

## 2018-02-15 NOTE — Consult Note (Addendum)
Cardiology Consultation:   Patient ID: Carolyn Mills MRN: [id number]; DOB: June 21, 1940  Admit date: 02/15/2018 Date of Consult: 02/15/2018  Primary Care Provider: System, [place] Not In Primary Cardiologist: Ferdinand Lango, MD, MD [facility]  Primary Electrophysiologist:  None    Patient Profile:   Carolyn Mills is a 77 y.o. 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 Earnest Conroy.  History of Present Illness:   Carolyn Mills with hx of diastolic HF, HTN, HLD, hx of DVT and PE on Eliquis and Pulmonary HTN with recent Rt heart cath at [facility].   Possible pulmonary HTN due to PE.  Also hx of Asthma.  She is visiting her daughter here in [place], and woke up with dry cough and dyspnea.  She was SOB that woke her from sleep.She was febrile and having chills.  She used an inhaler without improvement and EMS was called.  Temp in ER 102.9, neg Flu,   EKG I personally reviewed with SR and ST depression inf lat , no old to compare but per note she did have lateral depression on EKG 11/2017.   troponins then were neg. And prolonged QTc.  Tele: I personally reviewed, about 10 beats of ST at & AM   She has had 40 mg IV lasix and abx.    Troponin 0.08, 0.38 BNP 289 Lactic acid 1.78 Na 137, k+ 4.5, Cr 1.52,  WBC 14.4, Hgb 9.1, HCT 29.9, plts 319  Neg flu and resp screen  Blood culture P X 2  PCXR Cardiomegaly with mild vascular congestion. No focal consolidation.  Currently resting in bed.  She normally sleeps on 3 pillows she can walk in her home without dyspnea but not outside the home.  First she becomes SOB and then irregular HR.    No stress test or cath in past except for Rt heart cath.  No syncope.  No chest pain, in recent past or last night.  Does not  tolerate her head down very long.   Past Medical History:  Diagnosis Date  . Abnormal findings on diagnostic imaging of breast   . Anemia of chronic disease   . Arthritis   . Body mass index 40.0-44.9, adult ([place])   . Burning pain   . Chest pain   . Encounter for screening for malignant neoplasm of breast   . GERD (gastroesophageal reflux disease)   . HLD (hyperlipidemia)   . HTN (hypertension)   . Hyperlipemia   . 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 Long term (current) use of anticoagulants   . Long term use of drug   . Lymphedema   . Mild coronary artery disease   . Morbid obesity due to excess calories ([place])   . Osteoporosis   . Pulmonary embolism ([place])   . Pure hypercholesterolemia   . Rheumatoid arthritis ([place])   . Screen for colon cancer   . Shortness of breath   . Solitary lung nodule   . Tubular adenoma of colon   . Vitamin D deficiency     Past Surgical History:  Procedure Laterality Date  . CARDIAC CATHETERIZATION    . CARPAL TUNNEL RELEASE Right   . TOE SURGERY Bilateral  Home Medications:  Prior to Admission medications   Medication Sig Start Date End Date Taking? Authorizing Provider  Cholecalciferol (VITAMIN D-1000 MAX ST) 1000 units tablet Take 2,000 Units by mouth daily.   Yes [provider]  doxycycline (VIBRAMYCIN) 100 MG capsule Take 100 mg by mouth 2 (two) times daily. 01/16/18  Yes [provider]  ELIQUIS 5 MG TABS tablet Take 5 mg by mouth 2 (two) times daily. 01/22/18  Yes [provider]  folic acid (FOLVITE) 1 MG tablet Take 1 mg by mouth daily. 01/29/18  Yes [provider]  gabapentin (NEURONTIN) 300 MG capsule Take 300 mg by mouth 3 (three) times daily. 01/30/18  Yes [provider]  hydroxychloroquine (PLAQUENIL) 200 MG tablet Take 200 mg by mouth 2 (two) times daily. 01/23/18  Yes [provider]  isosorbide mononitrate (IMDUR) 30 MG 24 hr tablet Take 30 mg by  mouth at bedtime. 01/02/18  Yes [provider]  methotrexate (RHEUMATREX) 2.5 MG tablet Take 15 mg by mouth once a week. Takes every Thursday 11/28/17  Yes [provider]  metolazone (ZAROXOLYN) 2.5 MG tablet Take 2.5 mg by mouth 4 (four) times a week. 12/19/17  Yes [provider]  metoprolol tartrate (LOPRESSOR) 25 MG tablet Take 25 mg by mouth 2 (two) times daily. 02/08/18  Yes [provider]  Multiple Vitamin (MULTI-VITAMINS) TABS Take 1 tablet by mouth daily.   Yes [provider]  nitroGLYCERIN (NITROSTAT) 0.4 MG SL tablet Place 0.4 mg under the tongue every 5 (five) minutes as needed for chest pain.  12/19/17  Yes [provider]  Omega-3 Fatty Acids (FISH OIL) 1000 MG CAPS Take 1,000 mg by mouth 2 (two) times daily.   Yes [provider]  pantoprazole (PROTONIX) 40 MG tablet Take 40 mg by mouth 2 (two) times daily. 02/09/18  Yes [provider]  potassium chloride (MICRO-K) 10 MEQ CR capsule Take 10 mEq by mouth 2 (two) times daily. 01/02/18  Yes [provider]  predniSONE (DELTASONE) 5 MG tablet Take 5 mg by mouth every other day. 01/29/18  Yes [provider]  rosuvastatin (CRESTOR) 20 MG tablet Take 20 mg by mouth at bedtime. 12/19/17  Yes [provider]  vitamin B-12 (CYANOCOBALAMIN) 1000 MCG tablet Take 1,000 mcg by mouth daily.   Yes [provider]    Inpatient Medications: Scheduled Meds: . apixaban  5 mg Oral BID  . cholecalciferol  2,000 Units Oral Daily  . folic acid  1 mg Oral Daily  . furosemide  20 mg Intravenous BID  . gabapentin  300 mg Oral TID  . hydroxychloroquine  200 mg Oral BID  . metolazone  2.5 mg Oral Daily  . metoprolol tartrate  25 mg Oral BID  . multivitamin with minerals  1 tablet Oral Daily  . omega-3 acid ethyl esters  1 g Oral BID  . pantoprazole  40 mg Oral BID  . potassium chloride  20 mEq Oral Daily  . predniSONE  5 mg Oral QODAY  .  rosuvastatin  20 mg Oral QHS  . vitamin B-12  1,000 mcg Oral Daily   Continuous Infusions: . cefTRIAXone (ROCEPHIN)  IV 1 g (02/15/18 1533)  . magnesium sulfate 1 - 4 g bolus IVPB 2 g (02/15/18 1611)   PRN Meds: nitroGLYCERIN, ondansetron (ZOFRAN) IV, polyethylene glycol  Allergies:    Allergies  Allergen Reactions  . Propoxyphene     CAN take tylenol  . Clindamycin Rash  .  Doxycycline Rash  . Sulfamethoxazole-Trimethoprim Rash    Social History:   Social History   Socioeconomic History  . Marital status: Married    Spouse name: Not on file  . Number of children: Not on file  . Years of education: Not on file  . Highest education level: Not on file  Occupational History  . Not on file  Social Needs  . Financial resource strain: Not on file  . Food insecurity:    Worry: Not on file    Inability: Not on file  . Transportation needs:    Medical: Not on file    Non-medical: Not on file  Tobacco Use  . Smoking status: Never Smoker  . Smokeless tobacco: Never Used  Substance and Sexual Activity  . Alcohol use: No  . Drug use: No  . Sexual activity: Not on file  Lifestyle  . Physical activity:    Days per week: Not on file    Minutes per session: Not on file  . Stress: Not on file  Relationships  . Social connections:    Talks on phone: Not on file    Gets together: Not on file    Attends religious service: Not on file    Active member of club or organization: Not on file    Attends meetings of clubs or organizations: Not on file    Relationship status: Not on file  . Intimate partner violence:    Fear of current or ex partner: Not on file    Emotionally abused: Not on file    Physically abused: Not on file    Forced sexual activity: Not on file  Other Topics Concern  . Not on file  Social History Narrative  . Not on file    Family History:   Family History  Problem Relation Age of Onset  . Hypertension Mother   . Hypertension Sister   . Healthy  Brother      ROS:  Please see the history of present illness.  General:no colds + fevers, no weight changes Skin:no rashes or ulcers HEENT:no blurred vision, no congestion CV:see HPI PUL:see HPI GI:no diarrhea constipation or melena, no indigestion GU:no hematuria, no dysuria MS:+ bil knee pain-chronic, no claudication Neuro:no syncope, no lightheadedness Endo:no diabetes, no thyroid disease  All other ROS reviewed and negative.     Physical Exam/Data:   Vitals:   02/15/18 1250 02/15/18 1329 02/15/18 1602 02/15/18 1603  BP:  (!) 98/59 (!) 93/55 (!) 93/55  Pulse:  68 71 71  Resp:  18 18 18   Temp: 97.9 F (36.6 C) 98 F (36.7 C) 97.8 F (36.6 C) 97.8 F (36.6 C)  TempSrc: Oral Oral Oral Oral  SpO2:  100% 100% 100%  Weight:      Height:        Intake/Output Summary (Last 24 hours) at 02/15/2018 1633 Last data filed at 02/15/2018 1500 Gross per 24 hour  Intake 600 ml  Output 800 ml  Net -200 ml   Filed Weights   02/15/18 0413  Weight: 105.2 kg   Body mass index is 41.1 kg/m.  General:  Well nourished, well developed, in no acute distress sitting up , when head of bed goes down she becomes dyspneic  HEENT: normal Lymph: no adenopathy Neck: mild JVD Endocrine:  No thryomegaly Vascular: No carotid bruits;  Cardiac:  normal S1, S2; RRR; no murmur. [place] rub or click Lungs:  Breath sound to auscultation bilaterally, no wheezing,+ rhonchi no rales  Abd: obese, soft, nontender, no hepatomegaly  Ext: + 3 chonic edema Musculoskeletal:  No deformities, BUE and BLE strength normal and equal Skin: warm and dry  Neuro:  Alert and oriented X 3 MAE, follows commands, no focal abnormalities noted Psych:  Normal affect    Relevant CV Studies: 11/2017  Rt heart Cath RIGHT HEART CATHETERIZATION: RA 14 mmHg, RA saturation 64.7% RV 59/9, RVEDP 17 mmHg. PA 56/23 with mean 41 mmHg. PA Sat: 65% WP 25 mmHg, TPG 16 mm Hg CO 7.3 L/min, CI 3.6 L/min/m2 MAP 96 mm Hg CPO  1.55 PAPi 2.4 SVR 898 WU, PVR175 WU  SUMMARY OF THE FINDINGS: 1. Pre and post-capillary pulmonary HTN. Pre-capillary hypertension likely  secondary to previously known history of pulmonary embolism.  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 The EF is 55-60%. There is moderate concentric LVH. There is no segmental wall  motion abnormalities. There is grade II diastolic dysfunction with elevated LAP.  The RV is enlarged but with normal systolic function.  No clinically significant valvular abnormalities are noticed.  There is no evidence of pericardial effusion.  FINDINGS  Left Ventricle The LV has normal size and systolic function. The EF is 55-60%. There is moderate concentric LVH. There  is no segmental wall motion abnormalities. There is grade II diastolic dysfunction with elevated LAP.  Right Ventricle The RV is enlarged but with normal systolic function.  Right Atrium Mild right atrial dilatation.  Left Atrium Moderate left atrial dilatation.  Mitral Valve The mitral valve is normal in mobility and thickness. There is no mitral annular calcification.  Aortic Valve Diffuse thickening (sclerosis) of the aortic valve cusps without reduced excursion.  Tricuspid Valve Normal appearance and motion of the tricuspid valve.  Pulmonic Valve Pulmonic valve not well visualized.  Pericardium Normal pericardium without effusion.  Vessels Normal aortic root dimension. IVC appears normal.   MEASUREMENTS             (Female / Female) Normal Values  2D ECHO   LV Diastolic Diameter PLA 5.1 cm   4.2 - 5.9 / 3 LVOT Diameter       1.6 cm  LV Systolic Diameter PLAX 3.9 cm           Aortic Root Diameter    1.7 cm  IVS Diastolic Thickness  1.6 cm   0.6 - 1.0 / 0 LA Systolic Diameter LX  3.9 cm   3.0 - 4.0 / 2.7 -  3.8 cm  LVPW Diastolic Thickness  1.2 cm   0.6 - 1.0 / 0 LA Volume Index      49.1 cm/m 16 - 28 cm/m  LV Relative Wall Thicknes 0.5   M-MODE   LV Diastolic Diameter MM  5.8 cm   4.2 - 5.9 / 3 LVPW Diastolic Thickness  1.3 cm   0.6 - 1.0 / 0.6 - 0.9 cm  LV Systolic Diameter MM  4.5 cm           LV Relative Wall Thicknes 0.5     0.24 - 0.42 / 0.22 - 0.42  LV Ejection Fraction MM T 43.5 %           LV Mass Index MM      187.2 g/m 49 - 115 / 43 - 95 g/m  IVS Diastolic Thickness M 1.6 cm   0.6 - 1.0 /  0.6 - 0.9 cm   DOPPLER   AV Peak Velocity      196.7 cm/s         Mitral E Point Velocity  130.0 cm/s  AV Peak Gradient      15.5 mmHg         Mitral A Point Velocity  78.3 cm/s  AV Mean Gradient      8.0 mmHg          Mitral E to A Ratio    1.7  AV Velocity Time Integral 40.5 cm          MV Deceleration Time    229.0 ms  LVOT Peak Velocity     133.3 cm/s         LV E' Lateral Velocity   12.0 cm/s  LVOT Peak Gradient     7.1 mmHg          Mitral E to LV E' Lateral 10.8  LVOT Velocity Time Integr 29.5 cm          LV E' Septal Velocity   6.0 cm/s  AV Area Cont Eq vti    1.5 cm          Mitral E to LV E' Septal  21.7  AV Area Cont Eq pk     1.4 cm          PV Peak Velocity      81.4 cm/s  MV Area PHT        3.3 cm          PV Peak Gradient      2.7 mmHg  EKG 11/22/17 Normal sinus rhythm Prolonged QT Abnormal ECG When compared with ECG of 10-May-2016 08:45, Nonspecific T wave abnormality now evident in Lateral leads   Laboratory Data:  Chemistry Recent Labs  Lab 02/15/18 0408  NA 137  K 4.5  CL 103  CO2 25  GLUCOSE 99  BUN 23  CREATININE 1.52*  CALCIUM 8.9  GFRNONAA 32*  GFRAA 37*  ANIONGAP 9    Recent Labs  Lab 02/15/18 0408  PROT 7.5  ALBUMIN 3.2*   AST 34  ALT 14  ALKPHOS 48  BILITOT 1.5*   Hematology Recent Labs  Lab 02/15/18 0408  WBC 14.4*  RBC 3.51*  HGB 9.1*  HCT 29.9*  MCV 85.2  MCH 25.9*  MCHC 30.4  RDW 15.8*  PLT 319   Cardiac Enzymes Recent Labs  Lab 02/15/18 0408 02/15/18 0807  TROPONINI 0.08* 0.38*   No results for input(s): TROPIPOC in the last 168 hours.  BNP Recent Labs  Lab 02/15/18 0408  BNP 289.0*    DDimer No results for input(s): DDIMER in the last 168 hours.  Radiology/Studies:  Dg Chest Port 1 View  Result Date: 02/15/2018 CLINICAL DATA:  77 year old female with shortness of breath. EXAM: PORTABLE CHEST 1 VIEW COMPARISON:  None. FINDINGS: There is mild cardiomegaly with mild vascular congestion. No focal consolidation, pleural effusion, or pneumothorax. Calcified mitral annulus and atherosclerotic calcification of the aortic arch. Osteopenia with degenerative changes of the spine. No acute osseous pathology. IMPRESSION: Cardiomegaly with mild vascular congestion. No focal consolidation. Electronically Signed   By: Anner Crete M.D.   On: 02/15/2018 05:02    Assessment and Plan:   1. Elevated troponin and abnormal EKG may be combination of stress with respiratory distress, anemia, and her pulmonary hypertension- no chest pain. Would let recover from illness with fever and follow up with primary cardiologist.  Dr. Margaretann Loveless to see.   2. Fever, cough on IV rocephin per IM  3. Pulmonary hypertension pre-capillary and post capillary 4. Chronic diastolic HF with recent Rt heart cath 11/2017 she is neg 200 today and now on lasix 20 IV BID,  5. lymphedema of lower ext chronic  6. Hx of DVT/PE on eliquis 7. RA per IM 8. HLD continue statin. 9. Anemia chronic in 11/2017 she ws HGB of 8.4  10. Pulmonary fibrosis       For questions or updates, please contact [place] Please consult [email or web address] for contact info under     Signed, Cecilie Kicks, NP  02/15/2018 4:33  PM ---------------------------------------------------------------------------------------------   History and all data above reviewed.  Patient examined.  I agree with the findings as above.  Carolyn Mills feels better after diuresis and respiratory cares.  Constitutional: No acute distress Eyes: pupils equally round and reactive to light, sclera non-icteric, normal conjunctiva and lids ENMT: normal dentition, moist mucous membranes Cardiovascular: regular rhythm, normal rate, 1/6 systolic ejection murmur. S1 and S2 normal. Radial pulses normal bilaterally. Trace jugular venous distention.  Respiratory: clear to auscultation bilaterally GI : normal bowel sounds, soft and nontender. No distention.   MSK: extremities warm, well perfused. No edema.  NEURO: grossly nonfocal exam, moves all extremities. PSYCH: alert and oriented x 3, normal mood and affect.   All available labs, radiology testing, previous records reviewed. Agree with documented assessment and plan of my colleague as stated above with the following additions or changes:  Active Problems:   Sepsis ([place])   CHF exacerbation ([place])    Plan: Acute on chronic diastolic heart failure - we will increase her Lasix to 40 mg IV twice daily for the next 3 doses, given that her GFR is approximately 37 I think this will be a more effective diuretic dosing.  This can be decreased if renal function declines or if she appears more euvolemic and has a better respiratory status.  She is chest pain-free and has been, and there is no indication for coronary angiography at this time.  It is likely that her troponin elevation is related to demand ischemia.  We do not know her coronary anatomy, however I think this can be investigated as an outpatient with her primary cardiologist.  I spent several minutes explaining heart failure and pulmonary hypertension to her family.  It seems that with fever and respiratory decompensation, her primary  problems are respiratory in nature.  She should see her cardiologist in follow-up 1 to 2 weeks after hospital dismissal, where they can update her echocardiogram and consider stress testing or coronary angiography if indicated.  [facility] will sign off.   Medication Recommendations:  Lasix while in hospital, dose adjusted with respect to renal failure. Other recommendations (labs, testing, etc):  Follow up as an outpatient: She should see her primary cardiologist at [facility] in 1 to 2 weeks after hospital dismissal with plans for an updated echocardiogram and potential ischemia evaluation.   Elouise Munroe, MD [facility] 5:28 PM  02/15/2018

## 2018-02-15 NOTE — ED Notes (Signed)
Spoke w/ Melanie from [facility]; will add on respiratory panel

## 2018-02-15 NOTE — H&P (Addendum)
History and Physical    DOA: 02/15/2018  PCP: [facility] Patient coming from: Daughter's home  Chief Complaint: Shortness of breath  HPI: Carolyn Mills is a 77 y.o. female with history h/o CAD, DVT/PE on chronic anticoagulation, hypertension, hyperlipidemia, diastolic CHF (follows Dr. Rosary Lively with [facility]), chronic lymphedema and history of recurrent cellulitic leg infections presents today with complaints of dry cough and dyspnea that started last night.  Patient lives in [place] with her husband and is currently visiting daughter here in [place].  Patient apparently can walk more than 100 feet at baseline and drives around although with some difficulty due to chronic leg edema and arthritis.  She was doing well all day yesterday but woke up last night with some dry cough and dyspnea.  When daughter checked on her patient was febrile and was having chills.  Patient apparently has history of asthma and used her inhaler but was still dyspneic.  Daughter summoned EMS who evaluated her and treated her with O2 and nebulizer treatments after which she felt somewhat better.  In the ED here she was noted to be febrile with temp of 102.9 Fahrenheit.  Flu swab has been negative.  Chest x-ray showed pulmonary edema, EKG shows ST-T changes (no old EKG to compare), labs show AKI, BNP elevated at 289 and troponin at 0.08.  Patient denies any chest pain or productive cough.  She received 40 mg of IV Lasix and empiric antibiotics, she reports feeling better.  She denies any dysuria, diarrhea or worsening leg swellings.  She is compliant with her medications including anticoagulation.   Review of Systems: As per HPI otherwise 10 point review of systems negative.    Past Medical History:  Diagnosis Date  . Abnormal findings on diagnostic imaging of breast   . Anemia of chronic disease   . Arthritis   . Body mass index 40.0-44.9, adult ([place])   . Burning pain     . Chest pain   . Encounter for screening for malignant neoplasm of breast   . GERD (gastroesophageal reflux disease)   . HLD (hyperlipidemia)   . HTN (hypertension)   . Hyperlipemia   . 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 Long term (current) use of anticoagulants   . Long term use of drug   . Lymphedema   . Mild coronary artery disease   . Morbid obesity due to excess calories ([place])   . Osteoporosis   . Pulmonary embolism ([place])   . Pure hypercholesterolemia   . Rheumatoid arthritis ([place])   . Screen for colon cancer   . Shortness of breath   . Solitary lung nodule   . Tubular adenoma of colon   . Vitamin D deficiency     Past Surgical History:  Procedure Laterality Date  . CARDIAC CATHETERIZATION    . CARPAL TUNNEL RELEASE Right   . TOE SURGERY Bilateral     Social history:  reports that she has never smoked. She has never used smokeless tobacco. She reports that she does not drink alcohol or use drugs.   Allergies  Allergen Reactions  . Propoxyphene     CAN take tylenol  . Clindamycin Rash  . Doxycycline Rash  . Sulfamethoxazole-Trimethoprim Rash    Family history: Arthritis in mother, no history of heart disease, stroke or diabetes in the family   Prior to Admission medications   Medication Sig Start Date End Date Taking? Authorizing Provider  Cholecalciferol (  VITAMIN D-1000 MAX ST) 1000 units tablet Take 2,000 Units by mouth daily.   Yes [provider]  doxycycline (VIBRAMYCIN) 100 MG capsule Take 100 mg by mouth 2 (two) times daily. 01/16/18  Yes [provider]  ELIQUIS 5 MG TABS tablet Take 5 mg by mouth 2 (two) times daily. 01/22/18  Yes [provider]  folic acid (FOLVITE) 1 MG tablet Take 1 mg by mouth daily. 01/29/18  Yes [provider]  gabapentin (NEURONTIN) 300 MG capsule Take 300 mg by mouth 3 (three) times daily. 01/30/18  Yes [provider]  hydroxychloroquine (PLAQUENIL) 200 MG  tablet Take 200 mg by mouth 2 (two) times daily. 01/23/18  Yes [provider]  isosorbide mononitrate (IMDUR) 30 MG 24 hr tablet Take 30 mg by mouth at bedtime. 01/02/18  Yes [provider]  methotrexate (RHEUMATREX) 2.5 MG tablet Take 15 mg by mouth once a week. 11/28/17  Yes [provider]  metolazone (ZAROXOLYN) 2.5 MG tablet Take 2.5 mg by mouth 4 (four) times a week. 12/19/17  Yes [provider]  metoprolol tartrate (LOPRESSOR) 25 MG tablet Take 25 mg by mouth 2 (two) times daily. 02/08/18  Yes [provider]  Multiple Vitamin (MULTI-VITAMINS) TABS Take 1 tablet by mouth daily.   Yes [provider]  nitroGLYCERIN (NITROSTAT) 0.4 MG SL tablet Place 0.4 mg under the tongue every 5 (five) minutes as needed for chest pain.  12/19/17  Yes [provider]  Omega-3 Fatty Acids (FISH OIL) 1000 MG CAPS Take 1,000 mg by mouth 2 (two) times daily.   Yes [provider]  pantoprazole (PROTONIX) 40 MG tablet Take 40 mg by mouth 2 (two) times daily. 02/09/18  Yes [provider]  potassium chloride (MICRO-K) 10 MEQ CR capsule Take 10 mEq by mouth 2 (two) times daily. 01/02/18  Yes [provider]  predniSONE (DELTASONE) 5 MG tablet Take 5 mg by mouth every other day. 01/29/18  Yes [provider]  rosuvastatin (CRESTOR) 20 MG tablet Take 20 mg by mouth at bedtime. 12/19/17  Yes [provider]  vitamin B-12 (CYANOCOBALAMIN) 1000 MCG tablet Take 1,000 mcg by mouth daily.   Yes [provider]    Physical Exam: Vitals:   02/15/18 0745 02/15/18 0830 02/15/18 0900 02/15/18 0915  BP: (!) 93/53 (!) 101/53 101/62 96/62  Pulse: 83 84 80 84  Resp: 14 18 (!) 23 (!) 21  Temp:      TempSrc:      SpO2: 96% 97% 100% 98%  Weight:      Height:        Constitutional: NAD, calm, comfortable Vitals:   02/15/18 0745 02/15/18 0830 02/15/18 0900 02/15/18 0915  BP: (!) 93/53 (!) 101/53 101/62 96/62    Pulse: 83 84 80 84  Resp: 14 18 (!) 23 (!) 21  Temp:      TempSrc:      SpO2: 96% 97% 100% 98%  Weight:      Height:       Eyes: PERRL, lids and conjunctivae normal ENMT: Mucous membranes are moist. Posterior pharynx clear of any exudate or lesions.Normal dentition.  Neck: normal, supple, no masses, no thyromegaly Respiratory: clear to auscultation bilaterally, no wheezing, no crackles. Normal respiratory effort. No accessory muscle use.  Cardiovascular: Regular rate and rhythm, systolic murmur heard at aortic/apex area, no rubs / gallops. No carotid bruits.  Abdomen: no tenderness, no masses palpated. No hepatosplenomegaly. Bowel sounds positive.  Musculoskeletal:  no clubbing / cyanosis. No joint deformity upper and lower extremities. Good ROM, no contractures. Normal muscle tone.  Neurologic: CN 2-12 grossly intact. Sensation intact, DTR normal. Strength 5/5 in all 4.  Psychiatric: Normal judgment and insight. Alert and oriented x 3. Normal mood.  SKIN/catheters: Chronic bilateral lower extremity lymphedema with induration, no rashes, lesions, ulcers.   Labs on Admission: I have personally reviewed following labs and imaging studies  CBC: Recent Labs  Lab 02/15/18 0408  WBC 14.4*  NEUTROABS 13.5*  HGB 9.1*  HCT 29.9*  MCV 85.2  PLT 469   Basic Metabolic Panel: Recent Labs  Lab 02/15/18 0408  NA 137  K 4.5  CL 103  CO2 25  GLUCOSE 99  BUN 23  CREATININE 1.52*  CALCIUM 8.9   GFR: Estimated Creatinine Clearance: 36.5 mL/min (A) (by C-G formula based on SCr of 1.52 mg/dL (H)). Liver Function Tests: Recent Labs  Lab 02/15/18 0408  AST 34  ALT 14  ALKPHOS 48  BILITOT 1.5*  PROT 7.5  ALBUMIN 3.2*   No results for input(s): LIPASE, AMYLASE in the last 168 hours. No results for input(s): AMMONIA in the last 168 hours. Coagulation Profile: No results for input(s): INR, PROTIME in the last 168 hours. Cardiac Enzymes: Recent Labs  Lab 02/15/18 0408  TROPONINI  0.08*   BNP (last 3 results) No results for input(s): PROBNP in the last 8760 hours. HbA1C: No results for input(s): HGBA1C in the last 72 hours. CBG: No results for input(s): GLUCAP in the last 168 hours. Lipid Profile: No results for input(s): CHOL, HDL, LDLCALC, TRIG, CHOLHDL, LDLDIRECT in the last 72 hours. Thyroid Function Tests: No results for input(s): TSH, T4TOTAL, FREET4, T3FREE, THYROIDAB in the last 72 hours. Anemia Panel: No results for input(s): VITAMINB12, FOLATE, FERRITIN, TIBC, IRON, RETICCTPCT in the last 72 hours. Urine analysis: No results found for: COLORURINE, APPEARANCEUR, LABSPEC, PHURINE, GLUCOSEU, HGBUR, BILIRUBINUR, KETONESUR, PROTEINUR, UROBILINOGEN, NITRITE, LEUKOCYTESUR  Radiological Exams on Admission: Dg Chest Port 1 View  Result Date: 02/15/2018 CLINICAL DATA:  77 year old female with shortness of breath. EXAM: PORTABLE CHEST 1 VIEW COMPARISON:  None. FINDINGS: There is mild cardiomegaly with mild vascular congestion. No focal consolidation, pleural effusion, or pneumothorax. Calcified mitral annulus and atherosclerotic calcification of the aortic arch. Osteopenia with degenerative changes of the spine. No acute osseous pathology. IMPRESSION: Cardiomegaly with mild vascular congestion. No focal consolidation. Electronically Signed   By: Anner Crete M.D.   On: 02/15/2018 05:02    EKG: Independently reviewed.  ST elevations in aVR with ST depressions in lead II, lateral leads     Assessment and Plan:   1.  Acute on chronic diastolic CHF: Patient has pulmonary edema on chest x-ray, BNP slightly elevated and troponin slightly abnormal.  Will cycle cardiac enzymes and trend troponin.  Will obtain echocardiogram.  Resume home dose of metolazone daily and add IV Lasix twice daily. Monitor I's and O's, daily weights etc.  Consulted cardiology.  2.  Fever, cough: Unclear source.  Patient denies productive cough.  Chest x-ray negative for infiltrate.  Flu  test negative in the ED.  Not sure if cough related to problem #1.  Denies dysuria.    No history of sick contacts.  She does have leukocytosis.Check UA.  Check full respiratory viral panel.  Avoid aggressive IV hydration and concern for problem #1.  Empiric antibiotics, IV Rocephin.  3.  History of hypertension with mild hypotension now: Patient asymptomatic and states her blood pressure  is usually "not high but low".  Hold nitrates, reduce beta-blocker dosage with holding parameters.  Lactate normal.  4.  History of DVT/PE: On anticoagulation.  Given pulmonary edema on chest x-ray explaining dyspnea and given compliance with anticoagulants, will defer PE work-up.  5.  Chronic lymphedema: Resume home medications  6.  Rheumatoid arthritis: Hold methotrexate while on antibiotics per patient request.  7.  Hyperlipidemia: Resume home medications.  DVT prophylaxis: On anticoagulation  Code Status: Full code  Family Communication: Discussed with patient. Health care proxy would be  Consults called: Cardiology Admission status:  Patient admitted as inpatient as anticipated LOS greater than 2 midnights    Guilford Shi MD [facility] Pager [phone]  If 7PM-7AM, please contact night-coverage [email or web address] Password [email or web address]  02/15/2018, 9:40 AM

## 2018-02-15 NOTE — ED Notes (Signed)
Anna-(SR)-lunch tray ordered @ 0949-per Hannie, RN-called By Levada Dy

## 2018-02-15 NOTE — ED Notes (Signed)
Pt's family is in the room saying she wants her mother transferred to another hospital and does not want her to stay here. Pt stated to this nurse she wanted to stay here. I have informed Dr. Parks Ranger pt's family is very rude and wanting to leave AMA.

## 2018-02-16 DIAGNOSIS — N179 Acute kidney failure, unspecified: Secondary | ICD-10-CM

## 2018-02-16 DIAGNOSIS — D649 Anemia, unspecified: Secondary | ICD-10-CM

## 2018-02-16 DIAGNOSIS — R509 Fever, unspecified: Secondary | ICD-10-CM

## 2018-02-16 LAB — BASIC METABOLIC PANEL
Anion gap: 5 (ref 5–15)
BUN: 34 mg/dL — ABNORMAL HIGH (ref 8–23)
CHLORIDE: 100 mmol/L (ref 98–111)
CO2: 33 mmol/L — ABNORMAL HIGH (ref 22–32)
CREATININE: 1.64 mg/dL — AB (ref 0.44–1.00)
Calcium: 9 mg/dL (ref 8.9–10.3)
GFR calc Af Amer: 34 mL/min — ABNORMAL LOW (ref >60)
GFR calc non Af Amer: 29 mL/min — ABNORMAL LOW (ref >60)
Glucose, Bld: 99 mg/dL (ref 70–99)
POTASSIUM: 3.2 mmol/L — AB (ref 3.5–5.1)
SODIUM: 138 mmol/L (ref 135–145)

## 2018-02-16 LAB — CBC
HCT: 27.3 % — ABNORMAL LOW (ref 36.0–46.0)
HEMOGLOBIN: 8.6 g/dL — AB (ref 12.0–15.0)
MCH: 26.5 pg (ref 26.0–34.0)
MCHC: 31.5 g/dL (ref 30.0–36.0)
MCV: 84.3 fL (ref 80.0–100.0)
Platelets: 221 10*3/uL (ref 150–400)
RBC: 3.24 MIL/uL — AB (ref 3.87–5.11)
RDW: 15.5 % (ref 11.5–15.5)
WBC: 31.5 10*3/uL — ABNORMAL HIGH (ref 4.0–10.5)
nRBC: 0 % (ref 0.0–0.2)

## 2018-02-16 NOTE — Progress Notes (Addendum)
PROGRESS NOTE  Carolyn Mills JQB:[id number] DOB: [birth year] DOA: 02/15/2018 PCP: System, Pcp Not In  HPI/Recap of past 24 hours: Carolyn Mills is a 77 y.o. female with history h/o CAD, DVT/PE on chronic anticoagulation, hypertension, hyperlipidemia, diastolic CHF (follows Dr. Rosary Lively [facility]), chronic lymphedema and history of recurrent cellulitic leg infections presents today with complaints of dry cough and dyspnea that started last night.  Patient lives in [place] with her husband and is currently visiting daughter here in [place].    Admitted for acute on chronic diastolic CHF.  40/9/73: Patient seen and examined at her bedside.  No acute events overnight.  States she feels better her breathing is improved post diuretics.  Denies any chest pain or palpitations.   Assessment/Plan: Active Problems:   Sepsis ([place])   CHF exacerbation (H[facility])  Elevated troponin and abnormal EKG with suspected demand ischemia Troponin peaked at 0.38 from 0.08 Cardiology consulted and signed off.  No plan for intervention Denies chest pain Continue cardiac medications With follow-up with her primary cardiologist at [facility]  Acute on chronic diastolic CHF Continue to diuresis on Lasix Continue strict I's and O's and daily weight Continue Lopressor 20, metolazone, and Crestor  Pulmonary edema suspect from cardiac etiology Independently reviewed chest x-ray done on presentation which revealed increase in pulmonary vascularity with cardiomegaly suggestive of pulmonary edema Continue to diurese Maintain O2 saturation greater than 92%  Elevated creatinine with no baseline to compare Creatinine on presentation 1.52 with GFR of 37 Avoid nephrotoxic agents/dehydration/hypotension Repeat BMP today  Intractable cough with unclear source Influenza A and B negative Continue antitussives Continue breathing treatments as needed  History of DVT/PE Continue  anticoagulation with Eliquis  Chronic lymphedema Resume home medications  Rheumatoid arthritis On Plaquenil and prednisone  Hyperlipidemia Continue Crestor  Obesity BMI 38 Recommend weight loss outpatient   Code Status: Full code  Family Communication: Daughter at bedside.  All questions answered to her satisfaction.  Disposition Plan: Home when clinically stable possibly tomorrow 02/17/2018   Consultants:  Cardiology.  Signed off on 02/16/18  Procedures:  None  Antimicrobials:  None  DVT prophylaxis: Eliquis   Objective: Vitals:   02/16/18 0036 02/16/18 0534 02/16/18 0854 02/16/18 1153  BP: (!) 107/51 (!) 100/52 (!) 96/56 (!) 99/47  Pulse: 78 83 83 78  Resp: 18 18  17   Temp:  98.3 F (36.8 C)  98.2 F (36.8 C)  TempSrc:  Oral  Oral  SpO2: 98% 100% 100% 100%  Weight:  97.8 kg    Height:        Intake/Output Summary (Last 24 hours) at 02/16/2018 1459 Last data filed at 02/16/2018 1300 Gross per 24 hour  Intake 660 ml  Output 3950 ml  Net -3290 ml   Filed Weights   02/15/18 0413 02/16/18 0534  Weight: 105.2 kg 97.8 kg    Exam:  . General: 77 y.o. year-old female well developed well nourished in no acute distress.  Alert and oriented x3. . Cardiovascular: Regular rate and rhythm with no rubs or gallops.  No thyromegaly or JVD noted.   Marland Kitchen Respiratory: Diffuse rales bilaterally with no wheezes. Good inspiratory effort. . Abdomen: Soft nontender nondistended with normal bowel sounds x4 quadrants. . Musculoskeletal: Lymphedema affecting lower extremities bilaterally.  Unable to palpate pulses due to lymphedema. Marland Kitchen Psychiatry: Mood is appropriate for condition and setting   Data Reviewed: CBC: Recent Labs  Lab 02/15/18 0408  WBC 14.4*  NEUTROABS 13.5*  HGB 9.1*  HCT 29.9*  MCV 85.2  PLT 409   Basic Metabolic Panel: Recent Labs  Lab 02/15/18 0408 02/15/18 0807  NA 137  --   K 4.5  --   CL 103  --   CO2 25  --   GLUCOSE 99  --   BUN 23   --   CREATININE 1.52*  --   CALCIUM 8.9  --   MG  --  1.9   GFR: Estimated Creatinine Clearance: 35.1 mL/min (A) (by C-G formula based on SCr of 1.52 mg/dL (H)). Liver Function Tests: Recent Labs  Lab 02/15/18 0408  AST 34  ALT 14  ALKPHOS 48  BILITOT 1.5*  PROT 7.5  ALBUMIN 3.2*   No results for input(s): LIPASE, AMYLASE in the last 168 hours. No results for input(s): AMMONIA in the last 168 hours. Coagulation Profile: No results for input(s): INR, PROTIME in the last 168 hours. Cardiac Enzymes: Recent Labs  Lab 02/15/18 0408 02/15/18 0807  TROPONINI 0.08* 0.38*   BNP (last 3 results) No results for input(s): PROBNP in the last 8760 hours. HbA1C: No results for input(s): HGBA1C in the last 72 hours. CBG: No results for input(s): GLUCAP in the last 168 hours. Lipid Profile: No results for input(s): CHOL, HDL, LDLCALC, TRIG, CHOLHDL, LDLDIRECT in the last 72 hours. Thyroid Function Tests: No results for input(s): TSH, T4TOTAL, FREET4, T3FREE, THYROIDAB in the last 72 hours. Anemia Panel: No results for input(s): VITAMINB12, FOLATE, FERRITIN, TIBC, IRON, RETICCTPCT in the last 72 hours. Urine analysis:    Component Value Date/Time   COLORURINE COLORLESS (A) 02/15/2018 1257   APPEARANCEUR CLEAR 02/15/2018 1257   LABSPEC 1.004 (L) 02/15/2018 1257   PHURINE 5.0 02/15/2018 1257   GLUCOSEU NEGATIVE 02/15/2018 1257   HGBUR NEGATIVE 02/15/2018 1257   [place] 02/15/2018 1257   KETONESUR NEGATIVE 02/15/2018 1257   PROTEINUR NEGATIVE 02/15/2018 1257   NITRITE NEGATIVE 02/15/2018 1257   LEUKOCYTESUR NEGATIVE 02/15/2018 1257   Sepsis Labs: @LABRCNTIP (procalcitonin:4,lacticidven:4)  ) Recent Results (from the past 240 hour(s))  Blood Culture (routine x 2)     Status: None (Preliminary result)   Collection Time: 02/15/18  4:30 AM  Result Value Ref Range Status   Specimen Description BLOOD LEFT ANTECUBITAL  Final   Special Requests   Final    BOTTLES  DRAWN AEROBIC AND ANAEROBIC Blood Culture adequate volume   Culture   Final    NO GROWTH 1 DAY Performed at [facility], [place] [address] [place], [place] [address]    Report Status PENDING  Incomplete  Blood Culture (routine x 2)     Status: None (Preliminary result)   Collection Time: 02/15/18  4:45 AM  Result Value Ref Range Status   Specimen Description BLOOD RIGHT ANTECUBITAL  Final   Special Requests   Final    BOTTLES DRAWN AEROBIC AND ANAEROBIC Blood Culture results may not be optimal due to an excessive volume of blood received in culture bottles   Culture   Final    NO GROWTH 1 DAY Performed at [facility], [place] [address], [place], [place] [address]    Report Status PENDING  Incomplete  Respiratory Panel by PCR     Status: None   Collection Time: 02/15/18  7:55 AM  Result Value Ref Range Status   Adenovirus NOT DETECTED NOT DETECTED Final   Coronavirus 229E NOT DETECTED NOT DETECTED Final   Coronavirus HKU1 NOT DETECTED NOT DETECTED Final  Coronavirus NL63 NOT DETECTED NOT DETECTED Final   Coronavirus OC43 NOT DETECTED NOT DETECTED Final   Metapneumovirus NOT DETECTED NOT DETECTED Final   Rhinovirus / Enterovirus NOT DETECTED NOT DETECTED Final   Influenza A NOT DETECTED NOT DETECTED Final   Influenza B NOT DETECTED NOT DETECTED Final   Parainfluenza Virus 1 NOT DETECTED NOT DETECTED Final   Parainfluenza Virus 2 NOT DETECTED NOT DETECTED Final   Parainfluenza Virus 3 NOT DETECTED NOT DETECTED Final   Parainfluenza Virus 4 NOT DETECTED NOT DETECTED Final   Respiratory Syncytial Virus NOT DETECTED NOT DETECTED Final   Bordetella pertussis NOT DETECTED NOT DETECTED Final   Chlamydophila pneumoniae NOT DETECTED NOT DETECTED Final   Mycoplasma pneumoniae NOT DETECTED NOT DETECTED Final      Studies: No results found.  Scheduled Meds: . apixaban  5 mg Oral BID  . cholecalciferol  2,000 Units Oral Daily  . folic acid  1 mg Oral Daily  . furosemide  40  mg Intravenous BID  . gabapentin  300 mg Oral TID  . hydroxychloroquine  200 mg Oral BID  . metolazone  2.5 mg Oral Daily  . metoprolol tartrate  25 mg Oral BID  . multivitamin with minerals  1 tablet Oral Daily  . omega-3 acid ethyl esters  1 g Oral BID  . pantoprazole  40 mg Oral BID  . potassium chloride  20 mEq Oral Daily  . predniSONE  5 mg Oral QODAY  . rosuvastatin  20 mg Oral QHS  . vitamin B-12  1,000 mcg Oral Daily    Continuous Infusions: . cefTRIAXone (ROCEPHIN)  IV 1 g (02/16/18 1448)     LOS: 1 day     Kayleen Memos, MD [facility] Pager [phone]  If 7PM-7AM, please contact night-coverage [email or web address] Password [email or web address] 02/16/2018, 2:59 PM

## 2018-02-17 LAB — CBC WITH DIFFERENTIAL/PLATELET
Abs Immature Granulocytes: 0.21 10*3/uL — ABNORMAL HIGH (ref 0.00–0.07)
Basophils Absolute: 0.1 10*3/uL (ref 0.0–0.1)
Basophils Relative: 0 %
EOS PCT: 1 %
Eosinophils Absolute: 0.3 10*3/uL (ref 0.0–0.5)
HEMATOCRIT: 25.8 % — AB (ref 36.0–46.0)
HEMOGLOBIN: 8 g/dL — AB (ref 12.0–15.0)
Immature Granulocytes: 1 %
LYMPHS ABS: 1.7 10*3/uL (ref 0.7–4.0)
LYMPHS PCT: 8 %
MCH: 26.2 pg (ref 26.0–34.0)
MCHC: 31 g/dL (ref 30.0–36.0)
MCV: 84.6 fL (ref 80.0–100.0)
MONO ABS: 1 10*3/uL (ref 0.1–1.0)
MONOS PCT: 5 %
Neutro Abs: 18.5 10*3/uL — ABNORMAL HIGH (ref 1.7–7.7)
Neutrophils Relative %: 85 %
Platelets: 225 10*3/uL (ref 150–400)
RBC: 3.05 MIL/uL — AB (ref 3.87–5.11)
RDW: 15.7 % — ABNORMAL HIGH (ref 11.5–15.5)
WBC: 21.8 10*3/uL — AB (ref 4.0–10.5)
nRBC: 0 % (ref 0.0–0.2)

## 2018-02-17 LAB — BASIC METABOLIC PANEL
Anion gap: 12 (ref 5–15)
BUN: 35 mg/dL — AB (ref 8–23)
CHLORIDE: 96 mmol/L — AB (ref 98–111)
CO2: 30 mmol/L (ref 22–32)
CREATININE: 1.67 mg/dL — AB (ref 0.44–1.00)
Calcium: 8.7 mg/dL — ABNORMAL LOW (ref 8.9–10.3)
GFR calc Af Amer: 33 mL/min — ABNORMAL LOW (ref >60)
GFR calc non Af Amer: 29 mL/min — ABNORMAL LOW (ref >60)
Glucose, Bld: 78 mg/dL (ref 70–99)
POTASSIUM: 3.2 mmol/L — AB (ref 3.5–5.1)
Sodium: 138 mmol/L (ref 135–145)

## 2018-02-17 LAB — PROCALCITONIN: Procalcitonin: 8.04 ng/mL

## 2018-02-17 LAB — LACTIC ACID, PLASMA: Lactic Acid, Venous: 0.8 mmol/L (ref 0.5–1.9)

## 2018-02-17 LAB — MRSA PCR SCREENING: MRSA by PCR: NEGATIVE

## 2018-02-17 MED ORDER — IPRATROPIUM-ALBUTEROL 0.5-2.5 (3) MG/3ML IN SOLN
3.0000 mL | Freq: Four times a day (QID) | RESPIRATORY_TRACT | Status: DC
  Administered 2018-02-17 – 2018-02-18 (×6): 3 mL via RESPIRATORY_TRACT
  Filled 2018-02-17 (×6): qty 3

## 2018-02-17 MED ORDER — SODIUM CHLORIDE 0.9 % IV SOLN
500.0000 mg | INTRAVENOUS | Status: DC
  Administered 2018-02-17: 500 mg via INTRAVENOUS
  Filled 2018-02-17 (×2): qty 500

## 2018-02-17 MED ORDER — POTASSIUM CHLORIDE CRYS ER 20 MEQ PO TBCR
40.0000 meq | EXTENDED_RELEASE_TABLET | Freq: Two times a day (BID) | ORAL | Status: AC
  Administered 2018-02-17 (×2): 40 meq via ORAL
  Filled 2018-02-17 (×2): qty 2

## 2018-02-17 MED ORDER — DM-GUAIFENESIN ER 30-600 MG PO TB12
2.0000 | ORAL_TABLET | Freq: Two times a day (BID) | ORAL | Status: DC
  Administered 2018-02-17 – 2018-02-18 (×3): 2 via ORAL
  Filled 2018-02-17: qty 1
  Filled 2018-02-17 (×2): qty 2

## 2018-02-17 MED ORDER — SODIUM CHLORIDE 0.9 % IV SOLN
1.0000 g | INTRAVENOUS | Status: DC
  Administered 2018-02-17: 1 g via INTRAVENOUS
  Filled 2018-02-17 (×2): qty 10

## 2018-02-17 MED ORDER — SODIUM CHLORIDE 3 % IN NEBU
4.0000 mL | INHALATION_SOLUTION | Freq: Three times a day (TID) | RESPIRATORY_TRACT | Status: DC
  Administered 2018-02-17 – 2018-02-18 (×4): 4 mL via RESPIRATORY_TRACT
  Filled 2018-02-17 (×6): qty 4

## 2018-02-17 NOTE — Progress Notes (Addendum)
PROGRESS NOTE  [name] FBP:[id number] DOB: [birth year] DOA: 02/15/2018 PCP: System, Pcp Not In  HPI/Recap of past 24 hours: Carolyn Mills is a 77 y.o. female with history h/o CAD, DVT/PE on chronic anticoagulation, hypertension, hyperlipidemia, diastolic CHF (follows Dr. Rosary Lively [facility]), chronic lymphedema and history of recurrent cellulitic leg infections presents today with complaints of dry cough and dyspnea that started last night.  Patient lives in [place] with her husband and is currently visiting daughter here in [place].    Admitted for acute on chronic diastolic CHF.  82/4/23: Patient seen and examined at her bedside.  No acute events overnight.  States she feels better her breathing is improved post diuretics.  Denies any chest pain or palpitations.  02/17/2018: Patient seen and examined at bedside.  Reports cough is improving however still present.  Denies chest pain or dyspnea at rest.  Procalcitonin level elevated at 8 with leukocytosis.  Reports symptoms of cough and dyspnea started 4 days ago.  Suspect right lower lobe community-acquired pneumonia.   Assessment/Plan: Active Problems:   Sepsis ([place])   CHF exacerbation (HCC)  Elevated troponin and abnormal EKG with suspected demand ischemia Troponin peaked at 0.38 from 0.08 Cardiology consulted and signed off.  No plan for intervention Denies chest pain Continue cardiac medications With follow-up with her primary cardiologist at [facility]  Right lower lobe community-acquired pneumonia, present on admission Independently reviewed chest x-ray 12 admission which revealed right lower lobe infiltrates and increase of pulmonary vascularity Self-reported cough and dyspnea 4 days prior to presentation Influenza A and B negative Procalcitonin 8 on 02/17/2018 with WBC 23K Continue IV Rocephin Add IV azithromycin for CAP Start round-the-clock breathing treatment with duo nebs every 6  hours and pulmonary toilet with Mucinex 1200 mg twice daily and hypersaline nebs 3 times daily Repeat procalcitonin and CBC tomorrow  Acute on chronic diastolic CHF Continue to diuresis on Lasix Continue strict I's and O's and daily weight Continue Lopressor, metolazone, and Crestor  Elevated creatinine with no baseline to compare Creatinine on presentation 1.52 with GFR of 37 Creatinine this morning 1.67 from 1.64 yesterday Monitor urine output Avoid nephrotoxic agents/dehydration/hypotension Repeat BMP today  Hypokalemia Potassium 3.2 Repleted with 40 mEq twice daily x2 doses Also continue daily potassium 20 mEq Repeat BMP in the morning  History of DVT/PE Continue anticoagulation with Eliquis  Chronic lymphedema Noted No acute issues  Rheumatoid arthritis On Plaquenil and prednisone  Hyperlipidemia Continue Crestor  Obesity BMI 38 Recommend weight loss outpatient   Code Status: Full code  Family Communication: None at bedside this morning   Disposition Plan: Home possibly tomorrow 02/18/2018   Consultants:  Cardiology.  Signed off on 02/16/18  Procedures:  None  Antimicrobials:  IV Rocephin and IV azithromycin  DVT prophylaxis: Eliquis   Objective: Vitals:   02/16/18 1923 02/17/18 0618 02/17/18 0853 02/17/18 1243  BP: (!) 107/53 (!) 96/52 (!) 104/53 (!) 106/53  Pulse: 85 71 77 77  Resp: 18 18  18   Temp: 98.9 F (37.2 C) 98.6 F (37 C)  98.3 F (36.8 C)  TempSrc: Oral Oral  Oral  SpO2: 98% 100%  99%  Weight:  93.5 kg    Height:        Intake/Output Summary (Last 24 hours) at 02/17/2018 1346 Last data filed at 02/17/2018 0900 Gross per 24 hour  Intake 460 ml  Output 3151 ml  Net -2691 ml   Filed Weights   02/15/18  0413 02/16/18 0534 02/17/18 0618  Weight: 105.2 kg 97.8 kg 93.5 kg    Exam:  . General: 77 y.o. year-old female developed well-nourished in no acute distress.  Alert and interactive.   . Cardiovascular: Regular rate  and rhythm with no rubs or gallops.  No JVD or thyromegaly noted. Marland Kitchen Respiratory: Diffuse rales bilaterally with no wheezes. Good inspiratory effort. . Abdomen: Soft nontender nondistended with normal bowel sounds x4 quadrants. . Musculoskeletal: Lymphedema affecting lower extremities bilaterally.  Unable to palpate pulses due to lymphedema. Marland Kitchen Psychiatry: Mood is appropriate for condition and setting   Data Reviewed: CBC: Recent Labs  Lab 02/15/18 0408 02/16/18 1508 02/17/18 0549  WBC 14.4* 31.5* 21.8*  NEUTROABS 13.5*  --  18.5*  HGB 9.1* 8.6* 8.0*  HCT 29.9* 27.3* 25.8*  MCV 85.2 84.3 84.6  PLT 319 221 902   Basic Metabolic Panel: Recent Labs  Lab 02/15/18 0408 02/15/18 0807 02/16/18 1508 02/17/18 0549  NA 137  --  138 138  K 4.5  --  3.2* 3.2*  CL 103  --  100 96*  CO2 25  --  33* 30  GLUCOSE 99  --  99 78  BUN 23  --  34* 35*  CREATININE 1.52*  --  1.64* 1.67*  CALCIUM 8.9  --  9.0 8.7*  MG  --  1.9  --   --    GFR: Estimated Creatinine Clearance: 31.1 mL/min (A) (by C-G formula based on SCr of 1.67 mg/dL (H)). Liver Function Tests: Recent Labs  Lab 02/15/18 0408  AST 34  ALT 14  ALKPHOS 48  BILITOT 1.5*  PROT 7.5  ALBUMIN 3.2*   No results for input(s): LIPASE, AMYLASE in the last 168 hours. No results for input(s): AMMONIA in the last 168 hours. Coagulation Profile: No results for input(s): INR, PROTIME in the last 168 hours. Cardiac Enzymes: Recent Labs  Lab 02/15/18 0408 02/15/18 0807  TROPONINI 0.08* 0.38*   BNP (last 3 results) No results for input(s): PROBNP in the last 8760 hours. HbA1C: No results for input(s): HGBA1C in the last 72 hours. CBG: No results for input(s): GLUCAP in the last 168 hours. Lipid Profile: No results for input(s): CHOL, HDL, LDLCALC, TRIG, CHOLHDL, LDLDIRECT in the last 72 hours. Thyroid Function Tests: No results for input(s): TSH, T4TOTAL, FREET4, T3FREE, THYROIDAB in the last 72 hours. Anemia Panel: No  results for input(s): VITAMINB12, FOLATE, FERRITIN, TIBC, IRON, RETICCTPCT in the last 72 hours. Urine analysis:    Component Value Date/Time   COLORURINE COLORLESS (A) 02/15/2018 1257   APPEARANCEUR CLEAR 02/15/2018 1257   LABSPEC 1.004 (L) 02/15/2018 1257   PHURINE 5.0 02/15/2018 1257   GLUCOSEU NEGATIVE 02/15/2018 1257   HGBUR NEGATIVE 02/15/2018 1257   [place] 02/15/2018 1257   KETONESUR NEGATIVE 02/15/2018 1257   PROTEINUR NEGATIVE 02/15/2018 1257   NITRITE NEGATIVE 02/15/2018 1257   LEUKOCYTESUR NEGATIVE 02/15/2018 1257   Sepsis Labs: @LABRCNTIP (procalcitonin:4,lacticidven:4)  ) Recent Results (from the past 240 hour(s))  Blood Culture (routine x 2)     Status: None (Preliminary result)   Collection Time: 02/15/18  4:30 AM  Result Value Ref Range Status   Specimen Description BLOOD LEFT ANTECUBITAL  Final   Special Requests   Final    BOTTLES DRAWN AEROBIC AND ANAEROBIC Blood Culture adequate volume   Culture   Final    NO GROWTH 2 DAYS Performed at [facility], [place] [address] [place], [place] [address]  Report Status PENDING  Incomplete  Blood Culture (routine x 2)     Status: None (Preliminary result)   Collection Time: 02/15/18  4:45 AM  Result Value Ref Range Status   Specimen Description BLOOD RIGHT ANTECUBITAL  Final   Special Requests   Final    BOTTLES DRAWN AEROBIC AND ANAEROBIC Blood Culture results may not be optimal due to an excessive volume of blood received in culture bottles   Culture   Final    NO GROWTH 2 DAYS Performed at [facility], [place] [address], [place], [place] [address]    Report Status PENDING  Incomplete  Respiratory Panel by PCR     Status: None   Collection Time: 02/15/18  7:55 AM  Result Value Ref Range Status   Adenovirus NOT DETECTED NOT DETECTED Final   Coronavirus 229E NOT DETECTED NOT DETECTED Final   Coronavirus HKU1 NOT DETECTED NOT DETECTED Final   Coronavirus NL63 NOT DETECTED NOT DETECTED  Final   Coronavirus OC43 NOT DETECTED NOT DETECTED Final   Metapneumovirus NOT DETECTED NOT DETECTED Final   Rhinovirus / Enterovirus NOT DETECTED NOT DETECTED Final   Influenza A NOT DETECTED NOT DETECTED Final   Influenza B NOT DETECTED NOT DETECTED Final   Parainfluenza Virus 1 NOT DETECTED NOT DETECTED Final   Parainfluenza Virus 2 NOT DETECTED NOT DETECTED Final   Parainfluenza Virus 3 NOT DETECTED NOT DETECTED Final   Parainfluenza Virus 4 NOT DETECTED NOT DETECTED Final   Respiratory Syncytial Virus NOT DETECTED NOT DETECTED Final   Bordetella pertussis NOT DETECTED NOT DETECTED Final   Chlamydophila pneumoniae NOT DETECTED NOT DETECTED Final   Mycoplasma pneumoniae NOT DETECTED NOT DETECTED Final      Studies: No results found.  Scheduled Meds: . apixaban  5 mg Oral BID  . cholecalciferol  2,000 Units Oral Daily  . dextromethorphan-guaiFENesin  2 tablet Oral BID  . folic acid  1 mg Oral Daily  . gabapentin  300 mg Oral TID  . hydroxychloroquine  200 mg Oral BID  . ipratropium-albuterol  3 mL Nebulization Q6H  . metolazone  2.5 mg Oral Daily  . metoprolol tartrate  25 mg Oral BID  . multivitamin with minerals  1 tablet Oral Daily  . omega-3 acid ethyl esters  1 g Oral BID  . pantoprazole  40 mg Oral BID  . potassium chloride  20 mEq Oral Daily  . predniSONE  5 mg Oral QODAY  . rosuvastatin  20 mg Oral QHS  . sodium chloride HYPERTONIC  4 mL Nebulization TID  . vitamin B-12  1,000 mcg Oral Daily    Continuous Infusions: . cefTRIAXone (ROCEPHIN)  IV 1 g (02/16/18 1448)     LOS: 2 days     Kayleen Memos, MD [facility] Pager [phone]  If 7PM-7AM, please contact night-coverage [email or web address] Password [email or web address] 02/17/2018, 1:46 PM

## 2018-02-17 NOTE — Plan of Care (Signed)
  Problem: Education: Goal: Knowledge of General Education information will improve Description: Including pain rating scale, medication(s)/side effects and non-pharmacologic comfort measures Outcome: Progressing   Problem: Clinical Measurements: Goal: Ability to maintain clinical measurements within normal limits will improve Outcome: Progressing   Problem: Clinical Measurements: Goal: Diagnostic test results will improve Outcome: Progressing   Problem: Clinical Measurements: Goal: Respiratory complications will improve Outcome: Progressing   Problem: Elimination: Goal: Will not experience complications related to urinary retention Outcome: Progressing   Problem: Pain Managment: Goal: General experience of comfort will improve Outcome: Progressing   

## 2018-02-18 DIAGNOSIS — J189 Pneumonia, unspecified organism: Secondary | ICD-10-CM

## 2018-02-18 DIAGNOSIS — J181 Lobar pneumonia, unspecified organism: Secondary | ICD-10-CM

## 2018-02-18 LAB — PROCALCITONIN: PROCALCITONIN: 4.42 ng/mL

## 2018-02-18 LAB — CBC WITH DIFFERENTIAL/PLATELET
ABS IMMATURE GRANULOCYTES: 0.07 10*3/uL (ref 0.00–0.07)
Basophils Absolute: 0.1 10*3/uL (ref 0.0–0.1)
Basophils Relative: 1 %
Eosinophils Absolute: 0.3 10*3/uL (ref 0.0–0.5)
Eosinophils Relative: 2 %
HCT: 25.5 % — ABNORMAL LOW (ref 36.0–46.0)
HEMOGLOBIN: 8 g/dL — AB (ref 12.0–15.0)
Immature Granulocytes: 1 %
LYMPHS PCT: 14 %
Lymphs Abs: 1.8 10*3/uL (ref 0.7–4.0)
MCH: 26.3 pg (ref 26.0–34.0)
MCHC: 31.4 g/dL (ref 30.0–36.0)
MCV: 83.9 fL (ref 80.0–100.0)
MONO ABS: 0.8 10*3/uL (ref 0.1–1.0)
MONOS PCT: 6 %
Neutro Abs: 9.9 10*3/uL — ABNORMAL HIGH (ref 1.7–7.7)
Neutrophils Relative %: 76 %
Platelets: 230 10*3/uL (ref 150–400)
RBC: 3.04 MIL/uL — AB (ref 3.87–5.11)
RDW: 15.6 % — ABNORMAL HIGH (ref 11.5–15.5)
WBC: 12.9 10*3/uL — AB (ref 4.0–10.5)
nRBC: 0 % (ref 0.0–0.2)

## 2018-02-18 LAB — BASIC METABOLIC PANEL
Anion gap: 6 (ref 5–15)
BUN: 26 mg/dL — AB (ref 8–23)
CHLORIDE: 104 mmol/L (ref 98–111)
CO2: 28 mmol/L (ref 22–32)
Calcium: 8.7 mg/dL — ABNORMAL LOW (ref 8.9–10.3)
Creatinine, Ser: 1.36 mg/dL — ABNORMAL HIGH (ref 0.44–1.00)
GFR calc Af Amer: 43 mL/min — ABNORMAL LOW (ref >60)
GFR calc non Af Amer: 37 mL/min — ABNORMAL LOW (ref >60)
GLUCOSE: 87 mg/dL (ref 70–99)
POTASSIUM: 4 mmol/L (ref 3.5–5.1)
Sodium: 138 mmol/L (ref 135–145)

## 2018-02-18 LAB — MAGNESIUM: Magnesium: 2.2 mg/dL (ref 1.7–2.4)

## 2018-02-18 MED ORDER — CEPHALEXIN 250 MG PO CAPS
500.0000 mg | ORAL_CAPSULE | Freq: Three times a day (TID) | ORAL | Status: DC
  Administered 2018-02-18 (×2): 500 mg via ORAL
  Filled 2018-02-18 (×2): qty 2

## 2018-02-18 MED ORDER — CEPHALEXIN 500 MG PO CAPS: 500.0000 mg | ORAL_CAPSULE | Freq: Three times a day (TID) | ORAL | 0 refills | Status: AC

## 2018-02-18 MED ORDER — IPRATROPIUM-ALBUTEROL 0.5-2.5 (3) MG/3ML IN SOLN: 3.0000 mL | Freq: Two times a day (BID) | RESPIRATORY_TRACT | 0 refills | Status: DC | PRN

## 2018-02-18 MED ORDER — FUROSEMIDE 10 MG/ML IJ SOLN: 40.0000 mg | Freq: Once | INTRAMUSCULAR | Status: DC

## 2018-02-18 NOTE — Evaluation (Signed)
Physical Therapy Evaluation Patient Details Name: Carolyn Mills MRN: [id number] DOB: 08-09-1940 Today's Date: 02/18/2018   History of Present Illness  77 y.o. female with history h/o CAD, DVT/PE on chronic anticoagulation, hypertension, hyperlipidemia, diastolic CHF, chronic lymphedema and history of recurrent cellulitic leg infections who presented with complaints of dry cough and dyspnea.     Clinical Impression  PT eval complete. Pt demonstrated modified independence with bed mobility and transfers. Supervision provided for ambulation 200 feet with RW. See separate note for O2 needs. Pt is at her baseline for mobility. Plan is for d/c home today.    Follow Up Recommendations No PT follow up;Supervision - Intermittent    Equipment Recommendations  None recommended by PT    Recommendations for Other Services       Precautions / Restrictions Precautions Precautions: Other (comment) Precaution Comments: watch sats Restrictions Weight Bearing Restrictions: No      Mobility  Bed Mobility Overal bed mobility: Modified Independent                Transfers Overall transfer level: Modified independent Equipment used: Ambulation equipment used                Ambulation/Gait Ambulation/Gait assistance: Supervision Gait Distance (Feet): 200 Feet Assistive device: Rolling walker (2 wheeled) Gait Pattern/deviations: Step-through pattern;Decreased stride length Gait velocity: decreased Gait velocity interpretation: <1.31 ft/sec, indicative of household ambulator General Gait Details: steady gait. Pt ambulated on RA with desat to 91%.  Stairs            Wheelchair Mobility    Modified Rankin (Stroke Patients Only)       Balance Overall balance assessment: Mild deficits observed, not formally tested                                           Pertinent Vitals/Pain Pain Assessment: No/denies pain    Home Living Family/patient  expects to be discharged to:: Private residence Living Arrangements: Spouse/significant other Available Help at Discharge: Family;Available 24 hours/day Type of Home: House Home Access: Stairs to enter Entrance Stairs-Rails: Left Entrance Stairs-Number of Steps: 2 Home Layout: One level Home Equipment: Walker - 2 wheels;Cane - single point      Prior Function Level of Independence: Independent with assistive device(s)         Comments: ambulates with RW vs cane. Takes bird baths. Does cooking, cleaning, and drives.      Hand Dominance        Extremity/Trunk Assessment   Upper Extremity Assessment Upper Extremity Assessment: Overall WFL for tasks assessed    Lower Extremity Assessment Lower Extremity Assessment: Generalized weakness(lymphodema wraps bilat)    Cervical / Trunk Assessment Cervical / Trunk Assessment: Kyphotic  Communication   Communication: No difficulties  Cognition Arousal/Alertness: Awake/alert Behavior During Therapy: WFL for tasks assessed/performed Overall Cognitive Status: Within Functional Limits for tasks assessed                                        General Comments      Exercises     Assessment/Plan    PT Assessment Patent does not need any further PT services  PT Problem List         PT Treatment Interventions      PT Goals (  Current goals can be found in the Care Plan section)  Acute Rehab PT Goals Patient Stated Goal: home PT Goal Formulation: All assessment and education complete, DC therapy    Frequency     Barriers to discharge        Co-evaluation               AM-PAC PT "6 Clicks" Daily Activity  Outcome Measure Difficulty turning over in bed (including adjusting bedclothes, sheets and blankets)?: None Difficulty moving from lying on back to sitting on the side of the bed? : None Difficulty sitting down on and standing up from a chair with arms (e.g., wheelchair, bedside commode,  etc,.)?: None Help needed moving to and from a bed to chair (including a wheelchair)?: None Help needed walking in hospital room?: None Help needed climbing 3-5 steps with a railing? : A Little 6 Click Score: 23    End of Session Equipment Utilized During Treatment: Gait belt Activity Tolerance: Patient tolerated treatment well Patient left: in bed;with call bell/phone within reach Nurse Communication: Mobility status PT Visit Diagnosis: Difficulty in walking, not elsewhere classified (R26.2)    Time: 4854-6270 PT Time Calculation (min) (ACUTE ONLY): 11 min   Charges:   PT Evaluation $PT Eval Low Complexity: 1 Low          Lorrin Goodell, PT  Office # [phone] Pager [phone]   Lorriane Shire 02/18/2018, 12:40 PM

## 2018-02-18 NOTE — Discharge Summary (Signed)
Discharge Summary  [name] PJK:[id number] DOB: 16-Mar-1941  PCP: System, Pcp Not In  Admit date: 02/15/2018 Discharge date: 02/18/2018  Time spent: 35 minutes  Recommendations for Outpatient Follow-up:  1. Follow-up with your PCP 2. Follow-up with your cardiologist 3. Take your medications as prescribed 4. Continue physical therapy 5. Fall precautions  Discharge Diagnoses:  Active Hospital Problems   Diagnosis Date Noted  . CAP (community acquired pneumonia) 02/18/2018  . Sepsis ([place]) 02/15/2018  . CHF exacerbation ([place]) 02/15/2018    Resolved Hospital Problems  No resolved problems to display.    Discharge Condition: Stable  Diet recommendation: Resume previous diet  Vitals:   02/18/18 0519 02/18/18 0943  BP: 115/63 117/61  Pulse: 71 74  Resp: 18   Temp: 99.1 F (37.3 C)   SpO2: 97% 100%    History of present illness:  Carolyn Mills a 77 y.o.femalewith history h/oCAD, DVT/PE on chronic anticoagulation, hypertension, hyperlipidemia, diastolic CHF (follows Dr. Rosary Lively [facility]), chronic lymphedema and history of recurrent cellulitic leg infections presents today with complaints of dry cough and dyspnea that started last night. Patient lives in [place] with her husband and is currently visiting daughter here in [place].   Admitted for acute on chronic diastolic CHF.  80/9/98: Patient seen and examined at her bedside.  No acute events overnight.  States she feels better her breathing is improved post diuretics.  Denies any chest pain or palpitations.  02/17/2018: Patient seen and examined at bedside.  Reports cough is improving however still present.  Denies chest pain or dyspnea at rest.  Procalcitonin level elevated at 8 with leukocytosis.  Reports symptoms of cough and dyspnea started 4 days ago.  Suspect right lower lobe community-acquired pneumonia.  02/17/2018: Patient seen and examined with her daughter at bedside.   No acute events overnight.  Denies any chest pain, dyspnea or palpitation hours.  States her cough is improving.  Afebrile, WBC from 31.5K on presentation to 12.9K today 02/18/2018.  On the day of discharge, the patient was hemodynamically stable.  She will need to follow-up with her primary care provider and cardiologist post hospitalization.   Hospital Course:  Active Problems:   Sepsis ([place])   CHF exacerbation ([place])   CAP (community acquired pneumonia)  Elevated troponin and abnormal EKG with suspected demand ischemia Troponin peaked at 0.38 from 0.08 Cardiology consulted and signed off.  No plan for intervention Denies chest pain Continue cardiac medications With follow-up with her primary cardiologist at [facility]  Right lower lobe community-acquired pneumonia, present on admission Independently reviewed chest x-ray done on admission which revealed right lower lobe infiltrates and increase of pulmonary vascularity Self-reported cough and dyspnea 4 days prior to presentation Influenza A and B negative Procalcitonin 8 on 02/17/2018 with WBC 23K WBC continue to trend down from 31K on presentation to 12.9K today 02/18/2018 Completed 3 days of IV antibiotics Start Keflex 500 mg 3 times daily x5 days Continue duo nebs twice daily as needed for dyspnea and wheezing  Acute on chronic diastolic CHF Continue to diuresis on Lasix Continue strict I's and O's and daily weight Continue Lopressor, metolazone, and Crestor Follow-up with your cardiologist posthospitalization  Elevated creatinine with no baseline to compare Creatinine on presentation 1.52 with GFR of 37 Creatinine on 02/17/2018 1.67 from 1.64 yesterday Good urine output Creatinine on 02/18/2018 was much improved 1.36 with GFR of 43. Continue to avoid nephrotoxic agents Follow-up with your PCP posthospitalization  Resolved hypokalemia  post repletion Potassium 4.0 on 02/18/2018  History of DVT/PE Continue  anticoagulation with Eliquis  Chronic lymphedema Noted No acute issues  Rheumatoid arthritis On Plaquenil and prednisone Continue  Hyperlipidemia Continue Crestor  Obesity BMI 38 Recommend weight loss outpatient with regular physical activity and healthy dieting   Code Status: Full code   Consultants:  Cardiology.  Signed off on 02/16/18  Procedures:  None  Antimicrobials:  Completed 3 days of IV antibiotics Rocephin and IV azithromycin  DVT prophylaxis: Eliquis    Discharge Exam: BP 117/61   Pulse 74   Temp 99.1 F (37.3 C) (Oral)   Resp 18   Ht 5\' 3"  (1.6 m)   Wt 93.6 kg   SpO2 100%   BMI 36.54 kg/m  . General: 77 y.o. year-old female well developed well nourished in no acute distress.  Alert and oriented x3. . Cardiovascular: Regular rate and rhythm with no rubs or gallops.  No thyromegaly or JVD noted.   Marland Kitchen Respiratory: Clear to auscultation with no wheezes or rales. Good inspiratory effort. . Abdomen: Soft nontender nondistended with normal bowel sounds x4 quadrants. . Musculoskeletal: No lower extremity edema. 2/4 pulses in all 4 extremities. . Skin: No ulcerative lesions noted or rashes, . Psychiatry: Mood is appropriate for condition and setting  Discharge Instructions You were cared for by a hospitalist during your hospital stay. If you have any questions about your discharge medications or the care you received while you were in the hospital after you are discharged, you can call the unit and asked to speak with the hospitalist on call if the hospitalist that took care of you is not available. Once you are discharged, your primary care physician will handle any further medical issues. Please note that NO REFILLS for any discharge medications will be authorized once you are discharged, as it is imperative that you return to your primary care physician (or establish a relationship with a primary care physician if you do not have one) for  your aftercare needs so that they can reassess your need for medications and monitor your lab values.  Discharge Instructions    DME Nebulizer machine   Complete by:  As directed    Patient needs a nebulizer to treat with the following condition:  CAP (community acquired pneumonia)     Allergies as of 02/18/2018      Reactions   Propoxyphene    CAN take tylenol   Clindamycin Rash   Doxycycline Rash   Sulfamethoxazole-trimethoprim Rash      Medication List    STOP taking these medications   doxycycline 100 MG capsule Commonly known as:  VIBRAMYCIN     TAKE these medications   cephALEXin 500 MG capsule Commonly known as:  KEFLEX Take 1 capsule (500 mg total) by mouth every 8 (eight) hours for 5 days.   ELIQUIS 5 MG Tabs tablet Generic drug:  apixaban Take 5 mg by mouth 2 (two) times daily.   Fish Oil 1000 MG Caps Take 1,000 mg by mouth 2 (two) times daily.   folic acid 1 MG tablet Commonly known as:  FOLVITE Take 1 mg by mouth daily.   gabapentin 300 MG capsule Commonly known as:  NEURONTIN Take 300 mg by mouth 3 (three) times daily.   hydroxychloroquine 200 MG tablet Commonly known as:  PLAQUENIL Take 200 mg by mouth 2 (two) times daily.   ipratropium-albuterol 0.5-2.5 (3) MG/3ML Soln Commonly known as:  DUONEB Take 3 mLs by nebulization 2 (  two) times daily as needed.   isosorbide mononitrate 30 MG 24 hr tablet Commonly known as:  IMDUR Take 30 mg by mouth at bedtime.   methotrexate 2.5 MG tablet Commonly known as:  RHEUMATREX Take 15 mg by mouth once a week. Takes every Thursday   metolazone 2.5 MG tablet Commonly known as:  ZAROXOLYN Take 2.5 mg by mouth 4 (four) times a week.   metoprolol tartrate 25 MG tablet Commonly known as:  LOPRESSOR Take 25 mg by mouth 2 (two) times daily.   MULTI-VITAMINS Tabs Take 1 tablet by mouth daily.   nitroGLYCERIN 0.4 MG SL tablet Commonly known as:  NITROSTAT Place 0.4 mg under the tongue every 5 (five)  minutes as needed for chest pain.   pantoprazole 40 MG tablet Commonly known as:  PROTONIX Take 40 mg by mouth 2 (two) times daily.   potassium chloride 10 MEQ CR capsule Commonly known as:  MICRO-K Take 10 mEq by mouth 2 (two) times daily.   predniSONE 5 MG tablet Commonly known as:  DELTASONE Take 5 mg by mouth every other day.   rosuvastatin 20 MG tablet Commonly known as:  CRESTOR Take 20 mg by mouth at bedtime.   vitamin B-12 1000 MCG tablet Commonly known as:  CYANOCOBALAMIN Take 1,000 mcg by mouth daily.   VITAMIN D-1000 MAX ST 1000 units tablet Generic drug:  Cholecalciferol Take 2,000 Units by mouth daily.            Durable Medical Equipment  (From admission, onward)         Start     Ordered   02/18/18 0000  DME Nebulizer machine    Question:  Patient needs a nebulizer to treat with the following condition  Answer:  CAP (community acquired pneumonia)   02/18/18 0944         Allergies  Allergen Reactions  . Propoxyphene     CAN take tylenol  . Clindamycin Rash  . Doxycycline Rash  . Sulfamethoxazole-Trimethoprim Rash   Follow-up Information    Ferdinand Lango, MD. Call in 1 day(s).   Specialty:  Cardiology Why:  Please call for a post hospital follow-up appointment. Contact information: [place] [address] [phone]            The results of significant diagnostics from this hospitalization (including imaging, microbiology, ancillary and laboratory) are listed below for reference.    Significant Diagnostic Studies: Dg Chest Port 1 View  Result Date: 02/15/2018 CLINICAL DATA:  77 year old female with shortness of breath. EXAM: PORTABLE CHEST 1 VIEW COMPARISON:  None. FINDINGS: There is mild cardiomegaly with mild vascular congestion. No focal consolidation, pleural effusion, or pneumothorax. Calcified mitral annulus and atherosclerotic calcification of the aortic arch. Osteopenia with degenerative  changes of the spine. No acute osseous pathology. IMPRESSION: Cardiomegaly with mild vascular congestion. No focal consolidation. Electronically Signed   By: Anner Crete M.D.   On: 02/15/2018 05:02    Microbiology: Recent Results (from the past 240 hour(s))  Blood Culture (routine x 2)     Status: None (Preliminary result)   Collection Time: 02/15/18  4:30 AM  Result Value Ref Range Status   Specimen Description BLOOD LEFT ANTECUBITAL  Final   Special Requests   Final    BOTTLES DRAWN AEROBIC AND ANAEROBIC Blood Culture adequate volume   Culture   Final    NO GROWTH 2 DAYS Performed at [facility], [address] [place], [place] [address]  Report Status PENDING  Incomplete  Blood Culture (routine x 2)     Status: None (Preliminary result)   Collection Time: 02/15/18  4:45 AM  Result Value Ref Range Status   Specimen Description BLOOD RIGHT ANTECUBITAL  Final   Special Requests   Final    BOTTLES DRAWN AEROBIC AND ANAEROBIC Blood Culture results may not be optimal due to an excessive volume of blood received in culture bottles   Culture   Final    NO GROWTH 2 DAYS Performed at [facility], [place] [address], [place], [place] [address]    Report Status PENDING  Incomplete  Respiratory Panel by PCR     Status: None   Collection Time: 02/15/18  7:55 AM  Result Value Ref Range Status   Adenovirus NOT DETECTED NOT DETECTED Final   Coronavirus 229E NOT DETECTED NOT DETECTED Final   Coronavirus HKU1 NOT DETECTED NOT DETECTED Final   Coronavirus NL63 NOT DETECTED NOT DETECTED Final   Coronavirus OC43 NOT DETECTED NOT DETECTED Final   Metapneumovirus NOT DETECTED NOT DETECTED Final   Rhinovirus / Enterovirus NOT DETECTED NOT DETECTED Final   Influenza A NOT DETECTED NOT DETECTED Final   Influenza B NOT DETECTED NOT DETECTED Final   Parainfluenza Virus 1 NOT DETECTED NOT DETECTED Final   Parainfluenza Virus 2 NOT DETECTED NOT DETECTED Final   Parainfluenza Virus 3  NOT DETECTED NOT DETECTED Final   Parainfluenza Virus 4 NOT DETECTED NOT DETECTED Final   Respiratory Syncytial Virus NOT DETECTED NOT DETECTED Final   Bordetella pertussis NOT DETECTED NOT DETECTED Final   Chlamydophila pneumoniae NOT DETECTED NOT DETECTED Final   Mycoplasma pneumoniae NOT DETECTED NOT DETECTED Final  MRSA PCR Screening     Status: None   Collection Time: 02/17/18  2:50 PM  Result Value Ref Range Status   MRSA by PCR NEGATIVE NEGATIVE Final    Comment:        The GeneXpert MRSA Assay (FDA approved for NASAL specimens only), is one component of a comprehensive MRSA colonization surveillance program. It is not intended to diagnose MRSA infection nor to guide or monitor treatment for MRSA infections. Performed at [facility], [place] [address] [place], [place] [address]      Labs: Basic Metabolic Panel: Recent Labs  Lab 02/15/18 0408 02/15/18 0807 02/16/18 1508 02/17/18 0549 02/18/18 0500  NA 137  --  138 138 138  K 4.5  --  3.2* 3.2* 4.0  CL 103  --  100 96* 104  CO2 25  --  33* 30 28  GLUCOSE 99  --  99 78 87  BUN 23  --  34* 35* 26*  CREATININE 1.52*  --  1.64* 1.67* 1.36*  CALCIUM 8.9  --  9.0 8.7* 8.7*  MG  --  1.9  --   --  2.2   Liver Function Tests: Recent Labs  Lab 02/15/18 0408  AST 34  ALT 14  ALKPHOS 48  BILITOT 1.5*  PROT 7.5  ALBUMIN 3.2*   No results for input(s): LIPASE, AMYLASE in the last 168 hours. No results for input(s): AMMONIA in the last 168 hours. CBC: Recent Labs  Lab 02/15/18 0408 02/16/18 1508 02/17/18 0549 02/18/18 0500  WBC 14.4* 31.5* 21.8* 12.9*  NEUTROABS 13.5*  --  18.5* 9.9*  HGB 9.1* 8.6* 8.0* 8.0*  HCT 29.9* 27.3* 25.8* 25.5*  MCV 85.2 84.3 84.6 83.9  PLT 319 221 225 230   Cardiac Enzymes:  Recent Labs  Lab 02/15/18 0408 02/15/18 0807  TROPONINI 0.08* 0.38*   BNP: BNP (last 3 results) Recent Labs    02/15/18 0408  BNP 289.0*    ProBNP (last 3 results) No results for input(s):  PROBNP in the last 8760 hours.  CBG: No results for input(s): GLUCAP in the last 168 hours.     Signed:  Kayleen Memos, MD [facility] 02/18/2018, 9:45 AM

## 2018-02-18 NOTE — Progress Notes (Signed)
SATURATION QUALIFICATIONS: (This note is used to comply with regulatory documentation for home oxygen)  Patient Saturations on Room Air at Rest = 96%  Patient Saturations on Room Air while Ambulating = 87%  Patient Saturations on 3 Liters of oxygen while Ambulating = 93%

## 2018-02-18 NOTE — Progress Notes (Signed)
SATURATION QUALIFICATIONS: (This note is used to comply with regulatory documentation for home oxygen)  Patient Saturations on Room Air at Rest = 97%  Patient Saturations on Room Air while Ambulating = 91%  Patient Saturations on Liters of oxygen while Ambulating = n/a  Please briefly explain why patient needs home oxygen:

## 2018-02-18 NOTE — Discharge Instructions (Signed)
Community-Acquired Pneumonia, Adult Pneumonia is an infection of the lungs. One type of pneumonia can happen while a person is in a hospital. A different type can happen when a person is not in a hospital (community-acquired pneumonia). It is easy for this kind to spread from person to person. It can spread to you if you breathe near an infected person who coughs or sneezes. Some symptoms include:  A dry cough.  A wet (productive) cough.  Fever.  Sweating.  Chest pain.  Follow these instructions at home:  Take over-the-counter and prescription medicines only as told by your doctor. ? Only take cough medicine if you are losing sleep. ? If you were prescribed an antibiotic medicine, take it as told by your doctor. Do not stop taking the antibiotic even if you start to feel better.  Sleep with your head and neck raised (elevated). You can do this by putting a few pillows under your head, or you can sleep in a recliner.  Do not use tobacco products. These include cigarettes, chewing tobacco, and e-cigarettes. If you need help quitting, ask your doctor.  Drink enough water to keep your pee (urine) clear or pale yellow. A shot (vaccine) can help prevent pneumonia. Shots are often suggested for:  People older than 76 years of age.  People older than 76 years of age: ? Who are having cancer treatment. ? Who have long-term (chronic) lung disease. ? Who have problems with their body's defense system (immune system).  You may also prevent pneumonia if you take these actions:  Get the flu (influenza) shot every year.  Go to the dentist as often as told.  Wash your hands often. If soap and water are not available, use hand sanitizer.  Contact a doctor if:  You have a fever.  You lose sleep because your cough medicine does not help. Get help right away if:  You are short of breath and it gets worse.  You have more chest pain.  Your sickness gets worse. This is very serious  if: ? You are an older adult. ? Your body's defense system is weak.  You cough up blood. This information is not intended to replace advice given to you by your health care provider. Make sure you discuss any questions you have with your health care provider. Document Released: 09/21/2007 Document Revised: 09/10/2015 Document Reviewed: 07/30/2014 Elsevier Interactive Patient Education  2018 [employer].   Chronic Kidney Disease, Adult Chronic kidney disease (CKD) happens when the kidneys are damaged during a time of 3 or more months. The kidneys are two organs that do many important jobs in the body. These jobs include:  Removing wastes and extra fluids from the blood.  Making hormones that maintain the amount of fluid in your tissues and blood vessels.  Making sure that the body has the right amount of fluids and chemicals.  Most of the time, this condition does not go away, but it can usually be controlled. Steps must be taken to slow down the kidney damage or stop it from getting worse. Otherwise, the kidneys may stop working. Follow these instructions at home:  Follow your diet as told by your doctor. You may need to avoid alcohol, salty foods (sodium), and foods that are high in potassium, calcium, and protein.  Take over-the-counter and prescription medicines only as told by your doctor. Do not take any new medicines unless your doctor says you can do that. These include vitamins and minerals. ? Medicines and nutritional  supplements can make kidney damage worse. ? Your doctor may need to change how much medicine you take.  Do not use any tobacco products. These include cigarettes, chewing tobacco, and e-cigarettes. If you need help quitting, ask your doctor.  Keep all follow-up visits as told by your doctor. This is important.  Check your blood pressure. Tell your doctor if there are changes to your blood pressure.  Get to a healthy weight. Stay at that weight. If you need  help with this, ask your doctor.  Start or continue an exercise plan. Try to exercise at least 30 minutes a day, 5 days a week.  Stay up-to-date with your shots (immunizations) as told by your doctor. Contact a doctor if:  Your symptoms get worse.  You have new symptoms. Get help right away if:  You have symptoms of end-stage kidney disease. These include: ? Headaches. ? Skin that is darker or lighter than normal. ? Numbness in your hands or feet. ? Easy bruising. ? Having hiccups often. ? Chest pain. ? Shortness of breath. ? Stopping of menstrual periods in women.  You have a fever.  You are making very little pee (urine).  You have pain or bleeding when you pee (urinate). This information is not intended to replace advice given to you by your health care provider. Make sure you discuss any questions you have with your health care provider. Document Released: 06/29/2009 Document Revised: 09/10/2015 Document Reviewed: 12/02/2011 Elsevier Interactive Patient Education  2017 Elsevier Inc.   Cough, Adult A cough helps to clear your throat and lungs. A cough may last only 2-3 weeks (acute), or it may last longer than 8 weeks (chronic). Many different things can cause a cough. A cough may be a sign of an illness or another medical condition. Follow these instructions at home:  Pay attention to any changes in your cough.  Take medicines only as told by your doctor. ? If you were prescribed an antibiotic medicine, take it as told by your doctor. Do not stop taking it even if you start to feel better. ? Talk with your doctor before you try using a cough medicine.  Drink enough fluid to keep your pee (urine) clear or pale yellow.  If the air is dry, use a cold steam vaporizer or humidifier in your home.  Stay away from things that make you cough at work or at home.  If your cough is worse at night, try using extra pillows to raise your head up higher while you sleep.  Do not  smoke, and try not to be around smoke. If you need help quitting, ask your doctor.  Do not have caffeine.  Do not drink alcohol.  Rest as needed. Contact a doctor if:  You have new problems (symptoms).  You cough up yellow fluid (pus).  Your cough does not get better after 2-3 weeks, or your cough gets worse.  Medicine does not help your cough and you are not sleeping well.  You have pain that gets worse or pain that is not helped with medicine.  You have a fever.  You are losing weight and you do not know why.  You have night sweats. Get help right away if:  You cough up blood.  You have trouble breathing.  Your heartbeat is very fast. This information is not intended to replace advice given to you by your health care provider. Make sure you discuss any questions you have with your health care provider. Document  Released: 12/16/2010 Document Revised: 09/10/2015 Document Reviewed: 06/11/2014 Elsevier Interactive Patient Education  2018 [employer].   Acute Kidney Injury, Adult Acute kidney injury is a sudden worsening of kidney function. The kidneys are organs that have several jobs. They filter the blood to remove waste products and extra fluid. They also maintain a healthy balance of minerals and hormones in the body, which helps control blood pressure and keep bones strong. With this condition, your kidneys do not do their jobs as well as they should. This condition ranges from mild to severe. Over time it may develop into long-lasting (chronic) kidney disease. Early detection and treatment may prevent acute kidney injury from developing into a chronic condition. What are the causes? Common causes of this condition include:  A problem with blood flow to the kidneys. This may be caused by: ? Low blood pressure (hypotension) or shock. ? Blood loss. ? Heart and blood vessel (cardiovascular) disease. ? Severe burns. ? Liver disease.  Direct damage to the kidneys.  This may be caused by: ? Certain medicines. ? A kidney infection. ? Poisoning. ? Being around or in contact with toxic substances. ? A surgical wound. ? A hard, direct hit to the kidney area.  A sudden blockage of urine flow. This may be caused by: ? Cancer. ? Kidney stones. ? An enlarged prostate in males.  What are the signs or symptoms? Symptoms of this condition may not be obvious until the condition becomes severe. Symptoms of this condition can include:  Tiredness (lethargy), or difficulty staying awake.  Nausea or vomiting.  Swelling (edema) of the face, legs, ankles, or feet.  Problems with urination, such as: ? Abdominal pain, or pain along the side of your stomach (flank). ? Decreased urine production. ? Decrease in the force of urine flow.  Muscle twitches and cramps, especially in the legs.  Confusion or trouble concentrating.  Loss of appetite.  Fever.  How is this diagnosed? This condition may be diagnosed with tests, including:  Blood tests.  Urine tests.  Imaging tests.  A test in which a sample of tissue is removed from the kidneys to be examined under a microscope (kidney biopsy).  How is this treated? Treatment for this condition depends on the cause and how severe the condition is. In mild cases, treatment may not be needed. The kidneys may heal on their own. In more severe cases, treatment will involve:  Treating the cause of the kidney injury. This may involve changing any medicines you are taking or adjusting your dosage.  Fluids. You may need specialized IV fluids to balance your body's needs.  Having a catheter placed to drain urine and prevent blockages.  Preventing problems from occurring. This may mean avoiding certain medicines or procedures that can cause further injury to the kidneys.  In some cases treatment may also require:  A procedure to remove toxic wastes from the body (dialysis or continuous renal replacement therapy -  CRRT).  Surgery. This may be done to repair a torn kidney, or to remove the blockage from the urinary system.  Follow these instructions at home: Medicines  Take over-the-counter and prescription medicines only as told by your health care provider.  Do not take any new medicines without your health care provider's approval. Many medicines can worsen your kidney damage.  Do not take any vitamin and mineral supplements without your health care provider's approval. Many nutritional supplements can worsen your kidney damage. Lifestyle  If your health care provider prescribed  changes to your diet, follow them. You may need to decrease the amount of protein you eat.  Achieve and maintain a healthy weight. If you need help with this, ask your health care provider.  Start or continue an exercise plan. Try to exercise at least 30 minutes a day, 5 days a week.  Do not use any tobacco products, such as cigarettes, chewing tobacco, and e-cigarettes. If you need help quitting, ask your health care provider. General instructions  Keep track of your blood pressure. Report changes in your blood pressure as told by your health care provider.  Stay up to date with immunizations. Ask your health care provider which immunizations you need.  Keep all follow-up visits as told by your health care provider. This is important. Where to find more information:  American Association of Kidney Patients: BombTimer.gl  National Kidney Foundation: www.kidney.[place]: https://mathis.com/  Life Options Rehabilitation Program: ? www.lifeoptions.org ? www.kidneyschool.org Contact a health care provider if:  Your symptoms get worse.  You develop new symptoms. Get help right away if:  You develop symptoms of worsening kidney disease, which include: ? Headaches. ? Abnormally dark or light skin. ? Easy bruising. ? Frequent hiccups. ? Chest pain. ? Shortness of breath. ? End of menstruation  in women. ? Seizures. ? Confusion or altered mental status. ? Abdominal or back pain. ? Itchiness.  You have a fever.  Your body is producing less urine.  You have pain or bleeding when you urinate. Summary  Acute kidney injury is a sudden worsening of kidney function.  Acute kidney injury can be caused by problems with blood flow to the kidneys, direct damage to the kidneys, and sudden blockage of urine flow.  Symptoms of this condition may not be obvious until it becomes severe. Symptoms may include edema, lethargy, confusion, nausea or vomiting, and problems passing urine.  This condition can usually be diagnosed with blood tests, urine tests, and imaging tests. Sometimes a kidney biopsy is done to diagnose this condition.  Treatment for this condition often involves treating the underlying cause. It is treated with fluids, medicines, dialysis, diet changes, or surgery. This information is not intended to replace advice given to you by your health care provider. Make sure you discuss any questions you have with your health care provider. Document Released: 10/18/2010 Document Revised: 08/04/2016 Document Reviewed: 03/25/2016 Elsevier Interactive Patient Education  [employer].

## 2018-02-20 LAB — CULTURE, BLOOD (ROUTINE X 2)
Culture: NO GROWTH
Culture: NO GROWTH
Special Requests: ADEQUATE

## 2019-06-06 IMAGING — DX DG CHEST 1V PORT
1 series · 1 of 1 positions shown · non-contrast
Comparison: None.

CLINICAL DATA: 77-year-old female with shortness of breath.

EXAM:
PORTABLE CHEST 1 VIEW

[chest ap]
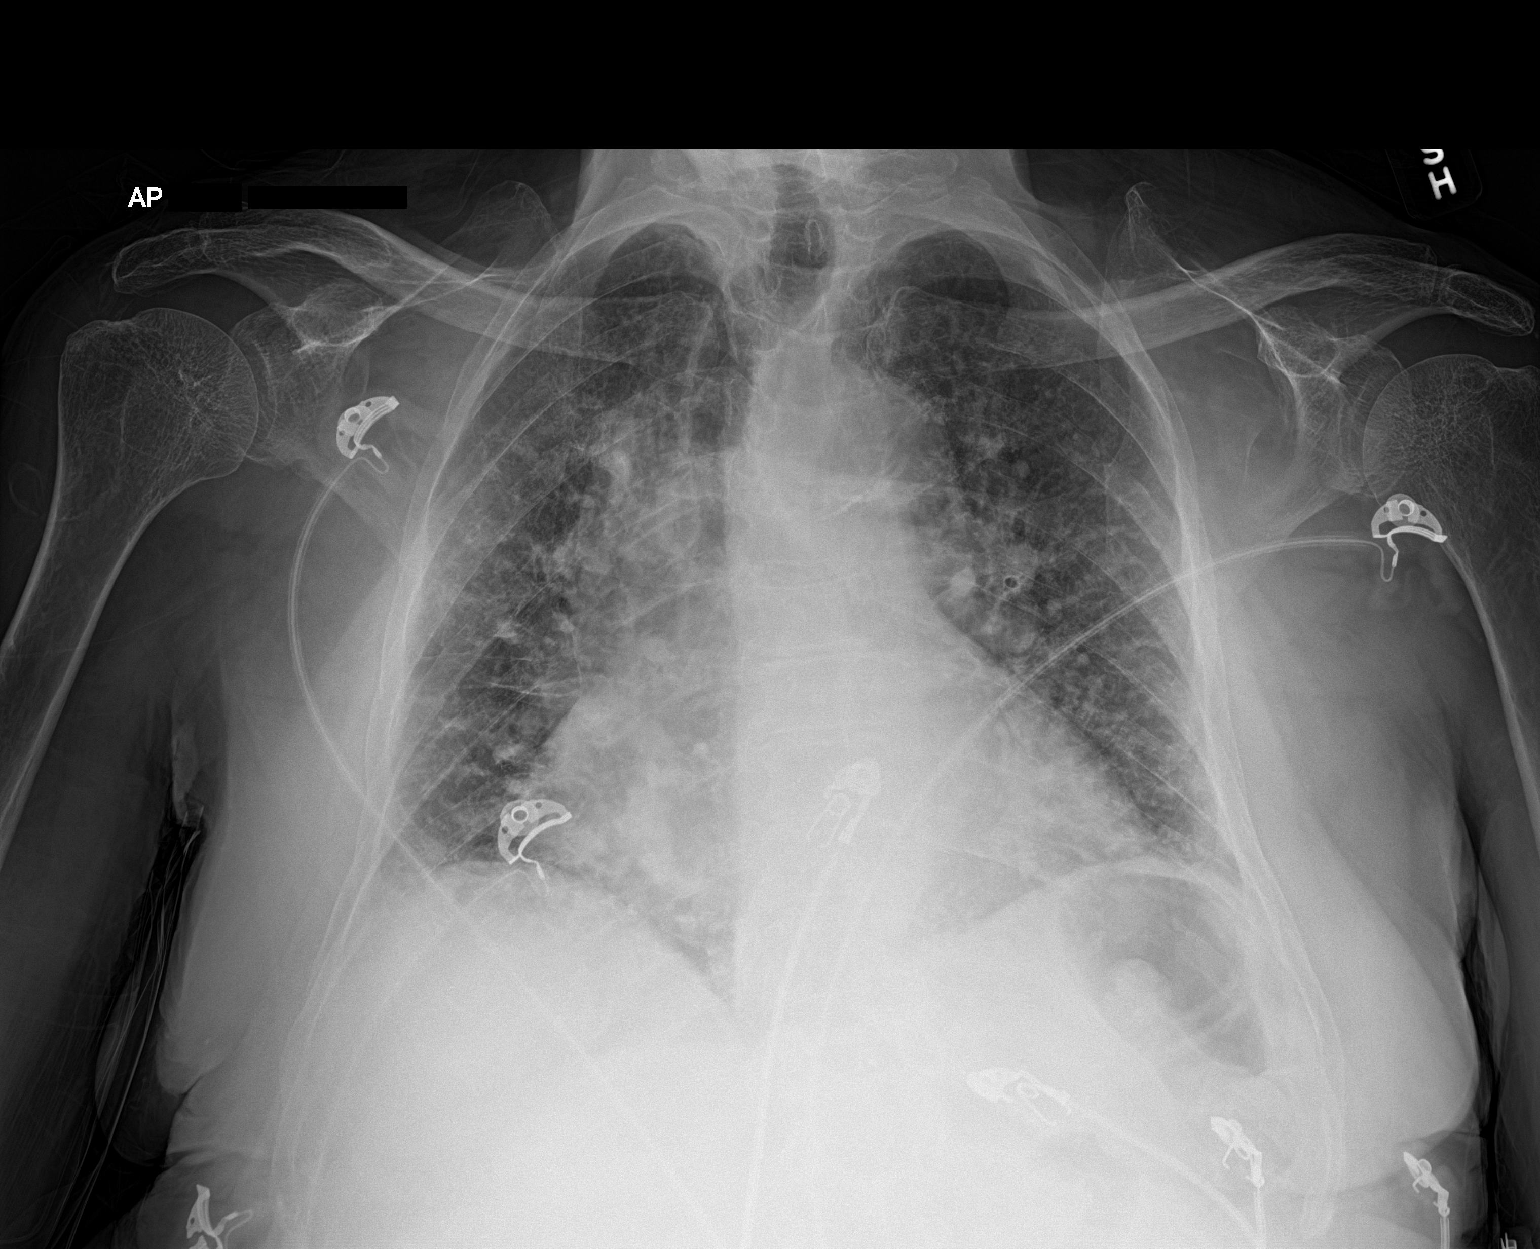

[1 of 1 positions shown; findings below may reference images not displayed]

FINDINGS: There is mild cardiomegaly with mild vascular congestion. No focal
consolidation, pleural effusion, or pneumothorax. Calcified mitral
annulus and atherosclerotic calcification of the aortic arch.
Osteopenia with degenerative changes of the spine. No acute osseous
pathology.
IMPRESSION: Cardiomegaly with mild vascular congestion. No focal consolidation.

## 2022-10-20 ENCOUNTER — Other Ambulatory Visit: Payer: Self-pay

## 2022-10-20 ENCOUNTER — Encounter (HOSPITAL_COMMUNITY): Payer: Self-pay

## 2022-10-20 ENCOUNTER — Emergency Department (HOSPITAL_COMMUNITY): Payer: Medicare Other

## 2022-10-20 ENCOUNTER — Inpatient Hospital Stay (HOSPITAL_COMMUNITY)
Admission: EM | Admit: 2022-10-20 | Discharge: 2022-10-25 | DRG: 176 | Disposition: A | Discharge status: Skilled Nursing Facility | Payer: Medicare Other | Attending: Internal Medicine | Admitting: Internal Medicine

## 2022-10-20 DIAGNOSIS — Z7901 Long term (current) use of anticoagulants: Secondary | ICD-10-CM

## 2022-10-20 DIAGNOSIS — I2699 Other pulmonary embolism without acute cor pulmonale: Secondary | ICD-10-CM | POA: Diagnosis present

## 2022-10-20 DIAGNOSIS — D631 Anemia in chronic kidney disease: Secondary | ICD-10-CM | POA: Diagnosis present

## 2022-10-20 DIAGNOSIS — Z6836 Body mass index (BMI) 36.0-36.9, adult: Secondary | ICD-10-CM

## 2022-10-20 DIAGNOSIS — Z8249 Family history of ischemic heart disease and other diseases of the circulatory system: Secondary | ICD-10-CM

## 2022-10-20 DIAGNOSIS — R634 Abnormal weight loss: Secondary | ICD-10-CM | POA: Diagnosis present

## 2022-10-20 DIAGNOSIS — I890 Lymphedema, not elsewhere classified: Secondary | ICD-10-CM | POA: Diagnosis present

## 2022-10-20 DIAGNOSIS — N184 Chronic kidney disease, stage 4 (severe): Secondary | ICD-10-CM | POA: Diagnosis present

## 2022-10-20 DIAGNOSIS — I10 Essential (primary) hypertension: Secondary | ICD-10-CM | POA: Diagnosis present

## 2022-10-20 DIAGNOSIS — I5032 Chronic diastolic (congestive) heart failure: Secondary | ICD-10-CM | POA: Diagnosis present

## 2022-10-20 DIAGNOSIS — Z9981 Dependence on supplemental oxygen: Secondary | ICD-10-CM

## 2022-10-20 DIAGNOSIS — E785 Hyperlipidemia, unspecified: Secondary | ICD-10-CM | POA: Diagnosis present

## 2022-10-20 DIAGNOSIS — Z881 Allergy status to other antibiotic agents status: Secondary | ICD-10-CM

## 2022-10-20 DIAGNOSIS — R627 Adult failure to thrive: Secondary | ICD-10-CM | POA: Diagnosis present

## 2022-10-20 DIAGNOSIS — R0602 Shortness of breath: Secondary | ICD-10-CM | POA: Diagnosis not present

## 2022-10-20 DIAGNOSIS — Z79899 Other long term (current) drug therapy: Secondary | ICD-10-CM

## 2022-10-20 DIAGNOSIS — I2694 Multiple subsegmental pulmonary emboli without acute cor pulmonale: Principal | ICD-10-CM | POA: Diagnosis present

## 2022-10-20 DIAGNOSIS — R911 Solitary pulmonary nodule: Secondary | ICD-10-CM | POA: Diagnosis present

## 2022-10-20 DIAGNOSIS — Z7952 Long term (current) use of systemic steroids: Secondary | ICD-10-CM

## 2022-10-20 DIAGNOSIS — E876 Hypokalemia: Secondary | ICD-10-CM | POA: Diagnosis present

## 2022-10-20 DIAGNOSIS — Z888 Allergy status to other drugs, medicaments and biological substances status: Secondary | ICD-10-CM

## 2022-10-20 DIAGNOSIS — M069 Rheumatoid arthritis, unspecified: Secondary | ICD-10-CM | POA: Diagnosis present

## 2022-10-20 DIAGNOSIS — M810 Age-related osteoporosis without current pathological fracture: Secondary | ICD-10-CM | POA: Diagnosis present

## 2022-10-20 DIAGNOSIS — I272 Pulmonary hypertension, unspecified: Secondary | ICD-10-CM | POA: Diagnosis present

## 2022-10-20 DIAGNOSIS — D638 Anemia in other chronic diseases classified elsewhere: Secondary | ICD-10-CM | POA: Diagnosis present

## 2022-10-20 DIAGNOSIS — Z634 Disappearance and death of family member: Secondary | ICD-10-CM

## 2022-10-20 DIAGNOSIS — I5042 Chronic combined systolic (congestive) and diastolic (congestive) heart failure: Secondary | ICD-10-CM | POA: Diagnosis present

## 2022-10-20 DIAGNOSIS — J9611 Chronic respiratory failure with hypoxia: Secondary | ICD-10-CM | POA: Diagnosis present

## 2022-10-20 DIAGNOSIS — M1990 Unspecified osteoarthritis, unspecified site: Secondary | ICD-10-CM | POA: Diagnosis present

## 2022-10-20 DIAGNOSIS — L89152 Pressure ulcer of sacral region, stage 2: Secondary | ICD-10-CM | POA: Diagnosis present

## 2022-10-20 DIAGNOSIS — E7800 Pure hypercholesterolemia, unspecified: Secondary | ICD-10-CM | POA: Diagnosis present

## 2022-10-20 DIAGNOSIS — Z882 Allergy status to sulfonamides status: Secondary | ICD-10-CM

## 2022-10-20 DIAGNOSIS — Z86711 Personal history of pulmonary embolism: Secondary | ICD-10-CM

## 2022-10-20 DIAGNOSIS — I130 Hypertensive heart and chronic kidney disease with heart failure and stage 1 through stage 4 chronic kidney disease, or unspecified chronic kidney disease: Secondary | ICD-10-CM | POA: Diagnosis present

## 2022-10-20 DIAGNOSIS — L8990 Pressure ulcer of unspecified site, unspecified stage: Secondary | ICD-10-CM | POA: Insufficient documentation

## 2022-10-20 DIAGNOSIS — I2510 Atherosclerotic heart disease of native coronary artery without angina pectoris: Secondary | ICD-10-CM | POA: Diagnosis present

## 2022-10-20 DIAGNOSIS — R0902 Hypoxemia: Secondary | ICD-10-CM

## 2022-10-20 LAB — BASIC METABOLIC PANEL
Anion gap: 13 (ref 5–15)
BUN: 26 mg/dL — ABNORMAL HIGH (ref 8–23)
CO2: 25 mmol/L (ref 22–32)
Calcium: 8.7 mg/dL — ABNORMAL LOW (ref 8.9–10.3)
Chloride: 96 mmol/L — ABNORMAL LOW (ref 98–111)
Creatinine, Ser: 1.47 mg/dL — ABNORMAL HIGH (ref 0.44–1.00)
GFR, Estimated: 36 mL/min — ABNORMAL LOW (ref >60)
Glucose, Bld: 141 mg/dL — ABNORMAL HIGH (ref 70–99)
Potassium: 3 mmol/L — ABNORMAL LOW (ref 3.5–5.1)
Sodium: 134 mmol/L — ABNORMAL LOW (ref 135–145)

## 2022-10-20 LAB — CBC
HCT: 26 % — ABNORMAL LOW (ref 36.0–46.0)
Hemoglobin: 8.1 g/dL — ABNORMAL LOW (ref 12.0–15.0)
MCH: 30.9 pg (ref 26.0–34.0)
MCHC: 31.2 g/dL (ref 30.0–36.0)
MCV: 99.2 fL (ref 80.0–100.0)
Platelets: 249 10*3/uL (ref 150–400)
RBC: 2.62 MIL/uL — ABNORMAL LOW (ref 3.87–5.11)
RDW: 15.8 % — ABNORMAL HIGH (ref 11.5–15.5)
WBC: 5.7 10*3/uL (ref 4.0–10.5)
nRBC: 0.4 % — ABNORMAL HIGH (ref 0.0–0.2)

## 2022-10-20 LAB — URINALYSIS, ROUTINE W REFLEX MICROSCOPIC
Bilirubin Urine: NEGATIVE
Glucose, UA: >=500 mg/dL — AB
Hgb urine dipstick: NEGATIVE
Ketones, ur: NEGATIVE mg/dL
Nitrite: NEGATIVE
Protein, ur: NEGATIVE mg/dL
Specific Gravity, Urine: 1.02 (ref 1.005–1.030)
pH: 6 (ref 5.0–8.0)

## 2022-10-20 LAB — URINALYSIS, MICROSCOPIC (REFLEX)

## 2022-10-20 LAB — BRAIN NATRIURETIC PEPTIDE: B Natriuretic Peptide: 466.4 pg/mL — ABNORMAL HIGH (ref 0.0–100.0)

## 2022-10-20 LAB — TROPONIN I (HIGH SENSITIVITY): Troponin I (High Sensitivity): 81 ng/L — ABNORMAL HIGH (ref <18)

## 2022-10-20 MED ORDER — POTASSIUM CHLORIDE 10 MEQ/100ML IV SOLN
10.0000 meq | INTRAVENOUS | Status: AC
  Administered 2022-10-20 – 2022-10-21 (×2): 10 meq via INTRAVENOUS
  Filled 2022-10-20 (×2): qty 100

## 2022-10-20 MED ORDER — IOHEXOL 350 MG/ML SOLN
75.0000 mL | Freq: Once | INTRAVENOUS | Status: AC | PRN
  Administered 2022-10-20: 75 mL via INTRAVENOUS

## 2022-10-20 NOTE — Progress Notes (Incomplete)
ANTICOAGULATION CONSULT NOTE - Initial Consult  Pharmacy Consult for heparin Indication: pulmonary embolus  Allergies  Allergen Reactions  . Propoxyphene     CAN take tylenol  . Clindamycin Rash  . Doxycycline Rash  . Sulfamethoxazole-Trimethoprim Rash    Patient Measurements: Height: 5\' 3"  (160 cm) Weight: 93.6 kg (206 lb 5.6 oz) IBW/kg (Calculated) : 52.4 Heparin Dosing Weight: 74 Kg  Vital Signs: BP: 109/65 (07/04 2230)  Labs: Recent Labs    10/20/22 2135 10/20/22 2245  HGB 8.1*  --   HCT 26.0*  --   PLT 249  --   CREATININE 1.47*  --   TROPONINIHS  --  81*    Estimated Creatinine Clearance: 32.6 mL/min (A) (by C-G formula based on SCr of 1.47 mg/dL (H)).   Medical History: Past Medical History:  Diagnosis Date  . Abnormal findings on diagnostic imaging of breast   . Anemia of chronic disease   . Arthritis   . Body mass index 40.0-44.9, adult (HCC)   . Burning pain   . Chest pain   . Encounter for screening for malignant neoplasm of breast   . GERD (gastroesophageal reflux disease)   . HLD (hyperlipidemia)   . HTN (hypertension)   . Hyperlipemia   . Idiopathic non-specific interstitial pneumonitis (HCC)   . Long term (current) use of anticoagulants   . Long term use of drug   . Lymphedema   . Mild coronary artery disease   . Morbid obesity due to excess calories (HCC)   . Osteoporosis   . Pulmonary embolism (HCC)   . Pure hypercholesterolemia   . Rheumatoid arthritis (HCC)   . Screen for colon cancer   . Shortness of breath   . Solitary lung nodule   . Tubular adenoma of colon   . Vitamin D deficiency     Assessment: 67 yoF presenting with SOB found to have PE on CT chest. Patient previously on Eliquis (stopped ~6 months ago) for PMH of PE. Hgb 8.1 on admission. PLT WNL. Pharmacy consulted to start heparin infusion.   Goal of Therapy:  Heparin level 0.3-0.7 units/ml Monitor platelets by anticoagulation protocol: Yes   Plan:  Give 4000  units bolus x 1 Start heparin infusion at 1200 units/hr Check anti-Xa level in 8 hours and daily while on heparin Continue to monitor H&H and platelets  Ruben Im, PharmD Clinical Pharmacist 10/20/2022 11:54 PM Please check AMION for all [facility] numbers

## 2022-10-20 NOTE — ED Notes (Signed)
Patient transported to CT 

## 2022-10-20 NOTE — ED Triage Notes (Signed)
PT BIB by [facility] from granddaughters home with a c/o of SOB.Had 40lbs of fluid removed from lungs a month ago. Was notified of a death in the family this morning and this is when the symptoms started and she has been declining food.

## 2022-10-20 NOTE — ED Provider Notes (Signed)
EMERGENCY DEPARTMENT AT [facility] Provider Note   CSN: [id number] Arrival date & time: 10/20/22  2120     History  Chief Complaint  Patient presents with   Shortness of Breath    Carolyn Mills is a 82 y.o. female.  History of rheumatoid arthritis for which she takes methotrexate, CAD, prior PE previously on Eliquis, hypertension, hyperlipidemia, diastolic heart failure who presents today for evaluation of acute on chronic shortness of breath, weight loss.  She was staying with her granddaughter earlier today and was notified of a death in the family and developed some shortness of breath after.  EMS was called.  She does wear oxygen at home and is on 2 L nasal cannula.  EMS transported her here for further evaluation.  Granddaughter reports that she has had a 40+ pound weight loss over the last 2 months.  This coincided with her starting Bumex, however she also had a recent diagnosis of oral thrush for which she has been taking Magic mouthwash, and has had a significant decline in her appetite recently.  Patient denies any chest pain, abdominal pain, nausea, vomiting or any other complaints.  The history is provided by the patient and a relative.  Shortness of Breath Associated symptoms: no abdominal pain, no chest pain, no cough, no fever and no vomiting        Home Medications Prior to Admission medications   Medication Sig Start Date End Date Taking? Authorizing Provider  Cholecalciferol (VITAMIN D-1000 MAX ST) 1000 units tablet Take 2,000 Units by mouth daily.    [provider]  ELIQUIS 5 MG TABS tablet Take 5 mg by mouth 2 (two) times daily. 01/22/18   [provider]  folic acid (FOLVITE) 1 MG tablet Take 1 mg by mouth daily. 01/29/18   [provider]  gabapentin (NEURONTIN) 300 MG capsule Take 300 mg by mouth 3 (three) times daily. 01/30/18   [provider]  hydroxychloroquine (PLAQUENIL) 200 MG tablet Take 200 mg  by mouth 2 (two) times daily. 01/23/18   [provider]  ipratropium-albuterol (DUONEB) 0.5-2.5 (3) MG/3ML SOLN Take 3 mLs by nebulization 2 (two) times daily as needed. 02/18/18   Darlin Drop, DO  isosorbide mononitrate (IMDUR) 30 MG 24 hr tablet Take 30 mg by mouth at bedtime. 01/02/18   [provider]  methotrexate (RHEUMATREX) 2.5 MG tablet Take 15 mg by mouth once a week. Takes every Thursday 11/28/17   [provider]  metolazone (ZAROXOLYN) 2.5 MG tablet Take 2.5 mg by mouth 4 (four) times a week. 12/19/17   [provider]  metoprolol tartrate (LOPRESSOR) 25 MG tablet Take 25 mg by mouth 2 (two) times daily. 02/08/18   [provider]  Multiple Vitamin (MULTI-VITAMINS) TABS Take 1 tablet by mouth daily.    [provider]  nitroGLYCERIN (NITROSTAT) 0.4 MG SL tablet Place 0.4 mg under the tongue every 5 (five) minutes as needed for chest pain.  12/19/17   [provider]  Omega-3 Fatty Acids (FISH OIL) 1000 MG CAPS Take 1,000 mg by mouth 2 (two) times daily.    [provider]  pantoprazole (PROTONIX) 40 MG tablet Take 40 mg by mouth 2 (two) times daily. 02/09/18   [provider]  potassium chloride (MICRO-K) 10 MEQ CR capsule Take 10 mEq by mouth 2 (two) times daily. 01/02/18   [provider]  predniSONE (DELTASONE) 5 MG tablet Take 5 mg by mouth every other  day. 01/29/18   [provider]  rosuvastatin (CRESTOR) 20 MG tablet Take 20 mg by mouth at bedtime. 12/19/17   [provider]  vitamin B-12 (CYANOCOBALAMIN) 1000 MCG tablet Take 1,000 mcg by mouth daily.    [provider]      Allergies    Propoxyphene, Clindamycin, Doxycycline, and Sulfamethoxazole-trimethoprim    Review of Systems   Review of Systems  Constitutional:  Positive for appetite change and fatigue. Negative for chills and fever.  HENT:  Positive for mouth sores.   Respiratory:  Positive for shortness of  breath. Negative for cough and chest tightness.   Cardiovascular:  Negative for chest pain.  Gastrointestinal:  Negative for abdominal pain, nausea and vomiting.  Genitourinary:  Negative for dysuria.    Physical Exam Updated Vital Signs Ht 5\' 3"  (1.6 m)   Wt 93.6 kg   BMI 36.55 kg/m  Physical Exam Vitals and nursing note reviewed.  Constitutional:      General: She is not in acute distress.    Appearance: She is underweight. She is ill-appearing. She is not toxic-appearing or diaphoretic.     Interventions: Nasal cannula in place.  HENT:     Head: Normocephalic.     Mouth/Throat:     Mouth: Mucous membranes are moist.  Eyes:     Extraocular Movements: Extraocular movements intact.     Pupils: Pupils are equal, round, and reactive to light.  Cardiovascular:     Rate and Rhythm: Normal rate and regular rhythm.  Pulmonary:     Effort: Pulmonary effort is normal. No tachypnea, bradypnea or respiratory distress.     Breath sounds: Examination of the left-lower field reveals decreased breath sounds. Decreased breath sounds present. No wheezing, rhonchi or rales.  Abdominal:     Palpations: Abdomen is soft. There is no mass.     Tenderness: There is no abdominal tenderness. There is no guarding.  Musculoskeletal:     Right lower leg: Edema present.     Left lower leg: Edema present.  Skin:    General: Skin is warm and dry.     Capillary Refill: Capillary refill takes less than 2 seconds.  Neurological:     General: No focal deficit present.     Mental Status: She is alert and oriented to person, place, and time.     ED Results / Procedures / Treatments   Labs (all labs ordered are listed, but only abnormal results are displayed) Labs Reviewed - No data to display  EKG EKG Interpretation Date/Time:  Thursday October 20 2022 21:27:26 EDT Ventricular Rate:  75 PR Interval:  176 QRS Duration:  113 QT Interval:  459 QTC Calculation: 513 R Axis:   3  Text  Interpretation: Sinus rhythm Ventricular premature complex Borderline intraventricular conduction delay Borderline repolarization abnormality Prolonged QT interval when compared top rior, similar appearnace with new PVC. No STEMI Confirmed by Theda Belfast ([id number]) on 10/20/2022 9:29:56 PM  Radiology No results found.  Procedures Procedures    Medications Ordered in ED Medications - No data to display  ED Course/ Medical Decision Making/ A&P                             Medical Decision Making 82 yof hx of HFrEF here for shortness of breath. She has undergone significant diuresis over the last several months and had significant weight loss. Per family, patient has been failing to thrive for  several months with decreased interactivity with family, decreased PO intake. Concerned for CHF exacerbation vs pneumonia or other infectious etiology vs PE in setting of recent discontinuation of DOAC. Given the timing of today's shortness of breath coinciding with the announcement of the death of a family member, cannot rule out a takotsubo or other ACS presentation. Will plan to pursue an ACS/CHF work up with a CTA PE study, and CXR. Revaluation pending labs. Likely admission.   BNP elevated, Hypokalemia noted and ordered repletion. CT PE study shows multiple pulmonary emboli. Started heparin and will page for admission. Hospitalist graciously agreed to admit the patient to their service for further care.   Amount and/or Complexity of Data Reviewed Labs: ordered. Radiology: ordered. ECG/medicine tests: ordered and independent interpretation performed.  Risk Prescription drug management. Decision regarding hospitalization.          Final Clinical Impression(s) / ED Diagnoses Final diagnoses:  None    Rx / DC Orders ED Discharge Orders     None         Fayrene Helper, MD 10/21/22 0129    Tegeler, Canary Brim, MD 10/21/22 2337

## 2022-10-20 NOTE — Progress Notes (Signed)
ANTICOAGULATION CONSULT NOTE - Initial Consult  Pharmacy Consult for heparin Indication: pulmonary embolus  Allergies  Allergen Reactions  . Propoxyphene     CAN take tylenol  . Clindamycin Rash  . Doxycycline Rash  . Sulfamethoxazole-Trimethoprim Rash    Patient Measurements: Height: 5' 3" (160 cm) Weight: 93.6 kg (206 lb 5.6 oz) IBW/kg (Calculated) : 52.4 Heparin Dosing Weight: 74 Kg  Vital Signs: BP: 109/65 (07/04 2230)  Labs: Recent Labs    10/20/22 2135 10/20/22 2245  HGB 8.1*  --   HCT 26.0*  --   PLT 249  --   CREATININE 1.47*  --   TROPONINIHS  --  81*    Estimated Creatinine Clearance: 32.6 mL/min (A) (by C-G formula based on SCr of 1.47 mg/dL (H)).   Medical History: Past Medical History:  Diagnosis Date  . Abnormal findings on diagnostic imaging of breast   . Anemia of chronic disease   . Arthritis   . Body mass index 40.0-44.9, adult (HCC)   . Burning pain   . Chest pain   . Encounter for screening for malignant neoplasm of breast   . GERD (gastroesophageal reflux disease)   . HLD (hyperlipidemia)   . HTN (hypertension)   . Hyperlipemia   . Idiopathic non-specific interstitial pneumonitis (HCC)   . Long term (current) use of anticoagulants   . Long term use of drug   . Lymphedema   . Mild coronary artery disease   . Morbid obesity due to excess calories (HCC)   . Osteoporosis   . Pulmonary embolism (HCC)   . Pure hypercholesterolemia   . Rheumatoid arthritis (HCC)   . Screen for colon cancer   . Shortness of breath   . Solitary lung nodule   . Tubular adenoma of colon   . Vitamin D deficiency     Assessment: 82 yoF presenting with SOB found to have PE on CT chest. Patient previously on Eliquis (stopped ~6 months ago) for PMH of PE. Hgb 8.1 on admission. PLT WNL. Pharmacy consulted to start heparin infusion.   Goal of Therapy:  Heparin level 0.3-0.7 units/ml Monitor platelets by anticoagulation protocol: Yes   Plan:  Give 4000  units bolus x 1 Start heparin infusion at 1200 units/hr Check anti-Xa level in 8 hours and daily while on heparin Continue to monitor H&H and platelets  Caterin Tabares, PharmD Clinical Pharmacist 10/20/2022 11:54 PM Please check AMION for all [facility] Pharmacy numbers    

## 2022-10-20 NOTE — ED Provider Notes (Signed)
  Provider Note MRN:  [id number]  Arrival date & time: 10/21/22    ED Course and Medical Decision Making  Assumed care from Dr. Rush Landmark at shift change.  Shortness of breath, weight loss, recent diuresis, awaiting CT PE study.  May need admission for further diuresis.  Patient sitting comfortably on my evaluation, doing well hemodynamically.  Awaiting admission.  .Critical Care  Performed by: Sabas Sous, MD Authorized by: Sabas Sous, MD   Critical care provider statement:    Critical care time (minutes):  30   Critical care was necessary to treat or prevent imminent or life-threatening deterioration of the following conditions: Pulmonary embolism.   Critical care was time spent personally by me on the following activities:  Development of treatment plan with patient or surrogate, discussions with consultants, evaluation of patient's response to treatment, examination of patient, ordering and review of laboratory studies, ordering and review of radiographic studies, ordering and performing treatments and interventions, pulse oximetry, re-evaluation of patient's condition and review of old charts   Final Clinical Impressions(s) / ED Diagnoses     ICD-10-CM   1. SOB (shortness of breath)  R06.02     2. Hypoxia  R09.02     3. Multiple subsegmental pulmonary emboli without acute cor pulmonale (HCC)  I26.94       ED Discharge Orders     None       Discharge Instructions   None     Elmer Sow. Pilar Plate, MD [facility] Emergency Medicine [facility] [email or web address]    Sabas Sous, MD 10/21/22 [phone]

## 2022-10-21 ENCOUNTER — Other Ambulatory Visit (HOSPITAL_COMMUNITY): Payer: Self-pay

## 2022-10-21 ENCOUNTER — Encounter (HOSPITAL_COMMUNITY): Payer: Self-pay | Admitting: Internal Medicine

## 2022-10-21 DIAGNOSIS — I272 Pulmonary hypertension, unspecified: Secondary | ICD-10-CM | POA: Diagnosis present

## 2022-10-21 DIAGNOSIS — I2699 Other pulmonary embolism without acute cor pulmonale: Secondary | ICD-10-CM | POA: Diagnosis present

## 2022-10-21 DIAGNOSIS — E876 Hypokalemia: Secondary | ICD-10-CM | POA: Diagnosis present

## 2022-10-21 DIAGNOSIS — M069 Rheumatoid arthritis, unspecified: Secondary | ICD-10-CM | POA: Diagnosis present

## 2022-10-21 DIAGNOSIS — I10 Essential (primary) hypertension: Secondary | ICD-10-CM

## 2022-10-21 DIAGNOSIS — D649 Anemia, unspecified: Secondary | ICD-10-CM

## 2022-10-21 DIAGNOSIS — J9611 Chronic respiratory failure with hypoxia: Secondary | ICD-10-CM

## 2022-10-21 DIAGNOSIS — D638 Anemia in other chronic diseases classified elsewhere: Secondary | ICD-10-CM | POA: Diagnosis present

## 2022-10-21 DIAGNOSIS — E785 Hyperlipidemia, unspecified: Secondary | ICD-10-CM | POA: Diagnosis present

## 2022-10-21 DIAGNOSIS — I5032 Chronic diastolic (congestive) heart failure: Secondary | ICD-10-CM

## 2022-10-21 DIAGNOSIS — N184 Chronic kidney disease, stage 4 (severe): Secondary | ICD-10-CM

## 2022-10-21 HISTORY — DX: Pulmonary hypertension, unspecified: I27.20

## 2022-10-21 HISTORY — DX: Essential (primary) hypertension: I10

## 2022-10-21 HISTORY — DX: Chronic kidney disease, stage 4 (severe): N18.4

## 2022-10-21 HISTORY — DX: Chronic diastolic (congestive) heart failure: I50.32

## 2022-10-21 HISTORY — DX: Hyperlipidemia, unspecified: E78.5

## 2022-10-21 HISTORY — DX: Chronic respiratory failure with hypoxia: J96.11

## 2022-10-21 LAB — BASIC METABOLIC PANEL
Anion gap: 9 (ref 5–15)
BUN: 23 mg/dL (ref 8–23)
CO2: 26 mmol/L (ref 22–32)
Calcium: 8.2 mg/dL — ABNORMAL LOW (ref 8.9–10.3)
Chloride: 101 mmol/L (ref 98–111)
Creatinine, Ser: 1.32 mg/dL — ABNORMAL HIGH (ref 0.44–1.00)
GFR, Estimated: 41 mL/min — ABNORMAL LOW (ref >60)
Glucose, Bld: 84 mg/dL (ref 70–99)
Potassium: 3.7 mmol/L (ref 3.5–5.1)
Sodium: 136 mmol/L (ref 135–145)

## 2022-10-21 LAB — TROPONIN I (HIGH SENSITIVITY): Troponin I (High Sensitivity): 89 ng/L — ABNORMAL HIGH (ref <18)

## 2022-10-21 LAB — BPAM RBC

## 2022-10-21 LAB — MAGNESIUM: Magnesium: 1.9 mg/dL (ref 1.7–2.4)

## 2022-10-21 LAB — CBC
HCT: 19.5 % — ABNORMAL LOW (ref 36.0–46.0)
Hemoglobin: 6.1 g/dL — CL (ref 12.0–15.0)
MCH: 30.7 pg (ref 26.0–34.0)
MCHC: 31.3 g/dL (ref 30.0–36.0)
MCV: 98 fL (ref 80.0–100.0)
Platelets: 213 10*3/uL (ref 150–400)
RBC: 1.99 MIL/uL — ABNORMAL LOW (ref 3.87–5.11)
RDW: 15.8 % — ABNORMAL HIGH (ref 11.5–15.5)
WBC: 4.7 10*3/uL (ref 4.0–10.5)
nRBC: 0 % (ref 0.0–0.2)

## 2022-10-21 LAB — ABO/RH: ABO/RH(D): A POS

## 2022-10-21 LAB — HEMOGLOBIN AND HEMATOCRIT, BLOOD
HCT: 25 % — ABNORMAL LOW (ref 36.0–46.0)
Hemoglobin: 8 g/dL — ABNORMAL LOW (ref 12.0–15.0)

## 2022-10-21 LAB — TYPE AND SCREEN: ABO/RH(D): A POS

## 2022-10-21 LAB — PREPARE RBC (CROSSMATCH)

## 2022-10-21 MED ORDER — POTASSIUM CHLORIDE 20 MEQ PO PACK
40.0000 meq | PACK | Freq: Once | ORAL | Status: AC
  Administered 2022-10-21: 40 meq via ORAL
  Filled 2022-10-21: qty 2

## 2022-10-21 MED ORDER — APIXABAN 5 MG PO TABS: 5.0000 mg | ORAL_TABLET | Freq: Two times a day (BID) | ORAL | Status: DC

## 2022-10-21 MED ORDER — APIXABAN 5 MG PO TABS
10.0000 mg | ORAL_TABLET | Freq: Two times a day (BID) | ORAL | Status: DC
  Administered 2022-10-21 – 2022-10-25 (×9): 10 mg via ORAL
  Filled 2022-10-21 (×9): qty 2

## 2022-10-21 MED ORDER — TAFAMIDIS 61 MG PO CAPS: 61.0000 mg | ORAL_CAPSULE | Freq: Every day | ORAL | Status: DC

## 2022-10-21 MED ORDER — APIXABAN 5 MG PO TABS: 5.0000 mg | ORAL_TABLET | Freq: Two times a day (BID) | ORAL | 2 refills | Status: DC

## 2022-10-21 MED ORDER — ONDANSETRON HCL 4 MG/2ML IJ SOLN
4.0000 mg | Freq: Four times a day (QID) | INTRAMUSCULAR | Status: DC | PRN
  Administered 2022-10-21: 4 mg via INTRAVENOUS
  Filled 2022-10-21 (×2): qty 2

## 2022-10-21 MED ORDER — HEPARIN BOLUS VIA INFUSION
4000.0000 [IU] | Freq: Once | INTRAVENOUS | Status: AC
  Administered 2022-10-21: 4000 [IU] via INTRAVENOUS
  Filled 2022-10-21: qty 4000

## 2022-10-21 MED ORDER — DOCUSATE SODIUM 100 MG PO CAPS
100.0000 mg | ORAL_CAPSULE | Freq: Every day | ORAL | Status: DC
  Administered 2022-10-21 – 2022-10-25 (×5): 100 mg via ORAL
  Filled 2022-10-21 (×5): qty 1

## 2022-10-21 MED ORDER — SODIUM CHLORIDE 0.9% IV SOLUTION: Freq: Once | INTRAVENOUS | Status: AC

## 2022-10-21 MED ORDER — ACETAMINOPHEN 325 MG PO TABS
650.0000 mg | ORAL_TABLET | Freq: Four times a day (QID) | ORAL | Status: DC | PRN
  Administered 2022-10-24: 650 mg via ORAL
  Filled 2022-10-21: qty 2

## 2022-10-21 MED ORDER — DAPAGLIFLOZIN PROPANEDIOL 10 MG PO TABS
10.0000 mg | ORAL_TABLET | Freq: Every day | ORAL | Status: DC
  Administered 2022-10-21 – 2022-10-24 (×4): 10 mg via ORAL
  Filled 2022-10-21 (×4): qty 1

## 2022-10-21 MED ORDER — ONDANSETRON HCL 4 MG PO TABS: 4.0000 mg | ORAL_TABLET | Freq: Four times a day (QID) | ORAL | Status: DC | PRN

## 2022-10-21 MED ORDER — HEPARIN (PORCINE) 25000 UT/250ML-% IV SOLN
1200.0000 [IU]/h | INTRAVENOUS | Status: DC
  Administered 2022-10-21: 1200 [IU]/h via INTRAVENOUS
  Filled 2022-10-21 (×2): qty 250

## 2022-10-21 MED ORDER — APIXABAN 5 MG PO TABS: ORAL_TABLET | ORAL | 1 refills | Status: AC

## 2022-10-21 MED ORDER — SPIRONOLACTONE 25 MG PO TABS
25.0000 mg | ORAL_TABLET | Freq: Every day | ORAL | Status: DC
  Administered 2022-10-21 – 2022-10-25 (×5): 25 mg via ORAL
  Filled 2022-10-21 (×5): qty 1

## 2022-10-21 MED ORDER — HYDROXYCHLOROQUINE SULFATE 200 MG PO TABS
200.0000 mg | ORAL_TABLET | Freq: Two times a day (BID) | ORAL | Status: DC
  Administered 2022-10-21 – 2022-10-25 (×9): 200 mg via ORAL
  Filled 2022-10-21 (×9): qty 1

## 2022-10-21 MED ORDER — SENNOSIDES-DOCUSATE SODIUM 8.6-50 MG PO TABS: 1.0000 | ORAL_TABLET | Freq: Every evening | ORAL | Status: DC | PRN

## 2022-10-21 MED ORDER — FOLIC ACID 1 MG PO TABS
1.0000 mg | ORAL_TABLET | Freq: Every day | ORAL | Status: DC
  Administered 2022-10-21 – 2022-10-25 (×5): 1 mg via ORAL
  Filled 2022-10-21 (×5): qty 1

## 2022-10-21 MED ORDER — APIXABAN 5 MG PO TABS
ORAL_TABLET | ORAL | 0 refills | Status: DC
  Filled 2022-10-21: qty 74, fill #0

## 2022-10-21 MED ORDER — FERROUS SULFATE 325 (65 FE) MG PO TABS
325.0000 mg | ORAL_TABLET | Freq: Every day | ORAL | Status: DC
  Administered 2022-10-21 – 2022-10-25 (×5): 325 mg via ORAL
  Filled 2022-10-21 (×5): qty 1

## 2022-10-21 MED ORDER — ACETAMINOPHEN 650 MG RE SUPP: 650.0000 mg | Freq: Four times a day (QID) | RECTAL | Status: DC | PRN

## 2022-10-21 MED ORDER — ROSUVASTATIN CALCIUM 20 MG PO TABS
20.0000 mg | ORAL_TABLET | Freq: Every day | ORAL | Status: DC
  Administered 2022-10-21 – 2022-10-24 (×4): 20 mg via ORAL
  Filled 2022-10-21 (×4): qty 1

## 2022-10-21 MED ORDER — SODIUM CHLORIDE 0.9% FLUSH
3.0000 mL | Freq: Two times a day (BID) | INTRAVENOUS | Status: DC
  Administered 2022-10-21 – 2022-10-25 (×10): 3 mL via INTRAVENOUS

## 2022-10-21 MED ORDER — VITAMIN B-12 1000 MCG PO TABS
1000.0000 ug | ORAL_TABLET | Freq: Every day | ORAL | Status: DC
  Administered 2022-10-21 – 2022-10-25 (×5): 1000 ug via ORAL
  Filled 2022-10-21 (×5): qty 1

## 2022-10-21 MED ORDER — CARBAMIDE PEROXIDE 6.5 % OT SOLN
5.0000 [drp] | Freq: Two times a day (BID) | OTIC | Status: DC
  Administered 2022-10-21 – 2022-10-25 (×7): 5 [drp] via OTIC
  Filled 2022-10-21: qty 15

## 2022-10-21 NOTE — Care Management Obs Status (Signed)
MEDICARE OBSERVATION STATUS NOTIFICATION   Patient Details  Name: Carolyn Mills MRN: [id number] Date of Birth: 09-30-1940   Medicare Observation Status Notification Given:  Yes    Kermit Balo, RN 10/21/2022, 3:14 PM

## 2022-10-21 NOTE — TOC Initial Note (Signed)
Transition of Care [facility]) - Initial/Assessment Note    Patient Details  Name: Carolyn Mills MRN: [id number] Date of Birth: [birth year]  [facility]) CM/SW Contact:    Kermit Balo, RN Phone Number: 10/21/2022, 3:16 PM  Clinical Narrative:                 Pt is from home with spouse in [place]. She is up in [place] staying with family for a couple weeks. Family she is staying with works from home. Oxygen brought to [facility] from her home. She doesn't remember the name of the company. Pt states she gets most of her medications through the [facility] mail delivery. She says she was on Eliquis before and was free through the mail.  She has PCP in [facility].  Family will provide transportation home when medically ready for d/c.  Awaiting therapy evals. TOC following   Expected Discharge Plan: Home/Self Care Barriers to Discharge: Continued Medical Work up   Patient Goals and CMS Choice            Expected Discharge Plan and Services   Discharge Planning Services: CM Consult   Living arrangements for the past 2 months: Single Family Home                                      Prior Living Arrangements/Services Living arrangements for the past 2 months: Single Family Home Lives with:: Spouse Patient language and need for interpreter reviewed:: Yes Do you feel safe going back to the place where you live?: Yes        Care giver support system in place?: Yes (comment) Current home services: DME (oxygen/ cane/ walker/) Criminal Activity/Legal Involvement Pertinent to Current Situation/Hospitalization: No - Comment as needed  Activities of Daily Living Home Assistive Devices/Equipment: Walker (specify type) ADL Screening (condition at time of admission) Patient's cognitive ability adequate to safely complete daily activities?: Yes Is the patient deaf or have difficulty hearing?: No Does the patient have difficulty seeing, even when wearing  glasses/contacts?: No Does the patient have difficulty concentrating, remembering, or making decisions?: Yes Patient able to express need for assistance with ADLs?: No Does the patient have difficulty dressing or bathing?: No Independently performs ADLs?: Yes (appropriate for developmental age) Does the patient have difficulty walking or climbing stairs?: No Weakness of Legs: None Weakness of Arms/Hands: None  Permission Sought/Granted                  Emotional Assessment Appearance:: Appears stated age Attitude/Demeanor/Rapport: Engaged Affect (typically observed): Accepting Orientation: : Oriented to Self, Oriented to Place, Oriented to  Time, Oriented to Situation   Psych Involvement: No (comment)  Admission diagnosis:  SOB (shortness of breath) [R06.02] Hypoxia [R09.02] Acute pulmonary embolism (HCC) [I26.99] Multiple subsegmental pulmonary emboli without acute cor pulmonale (HCC) [I26.94] Patient Active Problem List   Diagnosis Date Noted   Acute pulmonary embolism (HCC) 10/21/2022   Chronic heart failure with preserved ejection fraction (HFpEF) (HCC) 10/21/2022   Chronic respiratory failure with hypoxia (HCC) 10/21/2022   CKD (chronic kidney disease), stage IV (HCC) 10/21/2022   Anemia of chronic disease 10/21/2022   Hypokalemia 10/21/2022   Pulmonary hypertension (HCC) 10/21/2022   Essential hypertension 10/21/2022   Hyperlipidemia 10/21/2022   Rheumatoid arthritis (HCC) 10/21/2022   CAP (community acquired pneumonia) 02/18/2018   Sepsis (HCC) 02/15/2018   CHF exacerbation (HCC) 02/15/2018  PCP:  Pcp, No Pharmacy:   [facility] - [place], [place] - [address] [place] [address] [place] [address] Phone: [phone] Fax: [phone]  [facility] [address] [place] - [address] [place] [address] Phone: [phone] Fax: [phone]  [facility] -  [place], [place] - [address] [place] [address] Phone: [phone] Fax: [phone]  [facility] [address] [place] [address] Phone: [phone] Fax: [phone]     Social Determinants of Health (SDOH) Social History: SDOH Screenings   Food Insecurity: No Food Insecurity (10/21/2022)  Housing: Low Risk  (10/21/2022)  Transportation Needs: No Transportation Needs (10/21/2022)  Utilities: Not At Risk (10/21/2022)  Tobacco Use: Low Risk  (10/21/2022)   SDOH Interventions:     Readmission Risk Interventions     No data to display

## 2022-10-21 NOTE — Progress Notes (Addendum)
ANTICOAGULATION CONSULT NOTE   Pharmacy Consult for heparin >> apixaban Indication: pulmonary embolus  Allergies  Allergen Reactions   Propoxyphene     CAN take tylenol - unknown reaction   Clindamycin Rash   Doxycycline Rash   Sulfamethoxazole-Trimethoprim Rash    Patient Measurements: Height: 5\' 3"  (160 cm) Weight: 54.8 kg (120 lb 14.4 oz) IBW/kg (Calculated) : 52.4 Heparin Dosing Weight: 74 Kg  Vital Signs: Temp: 98 F (36.7 C) (07/05 0423) Temp Source: Oral (07/05 0423) BP: 101/48 (07/05 0423) Pulse Rate: 86 (07/05 0423)  Labs: Recent Labs    10/20/22 2135 10/20/22 2245 10/21/22 0030  HGB 8.1*  --   --   HCT 26.0*  --   --   PLT 249  --   --   CREATININE 1.47*  --   --   TROPONINIHS  --  81* 89*     Estimated Creatinine Clearance: 24.8 mL/min (A) (by C-G formula based on SCr of 1.47 mg/dL (H)).   Medical History: Past Medical History:  Diagnosis Date   Abnormal findings on diagnostic imaging of breast    Anemia of chronic disease    Arthritis    Body mass index 40.0-44.9, adult (HCC)    Burning pain    Chest pain    Chronic heart failure with preserved ejection fraction (HFpEF) (HCC) 10/21/2022   Chronic respiratory failure with hypoxia (HCC) 10/21/2022   CKD (chronic kidney disease), stage IV (HCC) 10/21/2022   Encounter for screening for malignant neoplasm of breast    Essential hypertension 10/21/2022   GERD (gastroesophageal reflux disease)    HLD (hyperlipidemia)    HTN (hypertension)    Hyperlipemia    Hyperlipidemia 10/21/2022   Idiopathic non-specific interstitial pneumonitis (HCC)    Long term (current) use of anticoagulants    Long term use of drug    Lymphedema    Mild coronary artery disease    Morbid obesity due to excess calories (HCC)    Osteoporosis    Pulmonary embolism (HCC)    Pulmonary hypertension (HCC) 10/21/2022   Pure hypercholesterolemia    Rheumatoid arthritis (HCC)    Screen for colon cancer    Shortness of  breath    Solitary lung nodule    Tubular adenoma of colon    Vitamin D deficiency     Assessment: 82 yo F presenting with SOB found to have segmental and subsegmental PE on CT chest, relevant PMH pulmonary HTN, PE, Stage IV CKD with associated anemia. Patient previously on Eliquis (stopped ~6 months ago) for PE. Hgb 8.1 on admission (baseline Hgb 8-9). PLT WNL. Pharmacy consulted to transition from heparin infusion to apixaban oral therapy.   Hgb returned today at 6.1, per MD will transfuse okay to continue full dose apixaban today.  Goal of Therapy: Monitor platelets by anticoagulation protocol: Yes   Plan:  Start apixaban 10 mg BID x7 days, then decrease to 5 mg BID thereafter Continue to monitor Hgb and platelets  Romie Minus, PharmD PGY1 Pharmacy Resident  Please check AMION for all [facility] Pharmacy phone numbers After 10:00 PM, call Main Pharmacy [phone]

## 2022-10-21 NOTE — ED Notes (Signed)
ED TO INPATIENT HANDOFF REPORT  ED Nurse Name and Phone #: Scheryl Marten RN, [phone]  S Name/Age/Gender Carolyn Mills 82 y.o. female Room/Bed: 022C/022C  Code Status   Code Status: Full Code  Home/SNF/Other Home Patient oriented to: self, place, time, and situation Is this baseline? Yes   Triage Complete: Triage complete  Chief Complaint Acute pulmonary embolism (HCC) [I26.99]  Triage Note PT BIB by [facility] from granddaughters home with a c/o of SOB.Had 40lbs of fluid removed from lungs a month ago. Was notified of a death in the family this morning and this is when the symptoms started and she has been declining food.   Allergies Allergies  Allergen Reactions   Propoxyphene     CAN take tylenol - unknown reaction   Clindamycin Rash   Doxycycline Rash   Sulfamethoxazole-Trimethoprim Rash    Level of Care/Admitting Diagnosis ED Disposition     ED Disposition  Admit   Condition  --   Comment  Hospital [facility]: [facility] [[id number]]  Level of Care: Telemetry Cardiac [[id number]]  May place patient in observation at [facility] or [facility] if equivalent level of care is available:: No  Covid Evaluation: Asymptomatic - no recent exposure (last 10 days) testing not required  Diagnosis: Acute pulmonary embolism [facility]) [[id number]]  Admitting Physician: Charlsie Quest [[id number]]  Attending Physician: Charlsie Quest [[id number]]          B Medical/Surgery History Past Medical History:  Diagnosis Date   Abnormal findings on diagnostic imaging of breast    Anemia of chronic disease    Arthritis    Body mass index 40.0-44.9, adult (HCC)    Burning pain    Chest pain    Chronic heart failure with preserved ejection fraction (HFpEF) (HCC) 10/21/2022   Chronic respiratory failure with hypoxia (HCC) 10/21/2022   CKD (chronic kidney disease), stage IV (HCC) 10/21/2022   Encounter for screening for malignant neoplasm of breast    Essential hypertension 10/21/2022    GERD (gastroesophageal reflux disease)    HLD (hyperlipidemia)    HTN (hypertension)    Hyperlipemia    Hyperlipidemia 10/21/2022   Idiopathic non-specific interstitial pneumonitis (HCC)    Long term (current) use of anticoagulants    Long term use of drug    Lymphedema    Mild coronary artery disease    Morbid obesity due to excess calories (HCC)    Osteoporosis    Pulmonary embolism (HCC)    Pulmonary hypertension (HCC) 10/21/2022   Pure hypercholesterolemia    Rheumatoid arthritis (HCC)    Screen for colon cancer    Shortness of breath    Solitary lung nodule    Tubular adenoma of colon    Vitamin D deficiency    Past Surgical History:  Procedure Laterality Date   CARDIAC CATHETERIZATION     CARPAL TUNNEL RELEASE Right    TOE SURGERY Bilateral      A IV Location/Drains/Wounds Patient Lines/Drains/Airways Status     Active Line/Drains/Airways     Name Placement date Placement time Site Days   Peripheral IV 10/20/22 20 G Anterior;Left Forearm 10/20/22  2137  Forearm  1            Intake/Output Last 24 hours No intake or output data in the 24 hours ending 10/21/22 0048  Labs/Imaging Results for orders placed or performed during the hospital encounter of 10/20/22 (from the past 48 hour(s))  CBC     Status: Abnormal  Collection Time: 10/20/22  9:35 PM  Result Value Ref Range   WBC 5.7 4.0 - 10.5 K/uL   RBC 2.62 (L) 3.87 - 5.11 MIL/uL   Hemoglobin 8.1 (L) 12.0 - 15.0 g/dL   HCT 16.1 (L) 09.6 - 04.5 %   MCV 99.2 80.0 - 100.0 fL   MCH 30.9 26.0 - 34.0 pg   MCHC 31.2 30.0 - 36.0 g/dL   RDW 40.9 (H) 81.1 - 91.4 %   Platelets 249 150 - 400 K/uL   nRBC 0.4 (H) 0.0 - 0.2 %    Comment: Performed at [facility], [address] [place], [place] [address]  Basic metabolic panel     Status: Abnormal   Collection Time: 10/20/22  9:35 PM  Result Value Ref Range   Sodium 134 (L) 135 - 145 mmol/L   Potassium 3.0 (L) 3.5 - 5.1 mmol/L   Chloride 96 (L) 98 -  111 mmol/L   CO2 25 22 - 32 mmol/L   Glucose, Bld 141 (H) 70 - 99 mg/dL    Comment: Glucose reference range applies only to samples taken after fasting for at least 8 hours.   BUN 26 (H) 8 - 23 mg/dL   Creatinine, Ser 6.21 (H) 0.44 - 1.00 mg/dL   Calcium 8.7 (L) 8.9 - 10.3 mg/dL   GFR, Estimated 36 (L) >60 mL/min    Comment: (NOTE) Calculated using the CKD-EPI Creatinine Equation (2021)    Anion gap 13 5 - 15    Comment: Performed at [facility], [address] [place], [place] [address]  Brain natriuretic peptide     Status: Abnormal   Collection Time: 10/20/22  9:35 PM  Result Value Ref Range   B Natriuretic Peptide 466.4 (H) 0.0 - 100.0 pg/mL    Comment: Performed at [facility], [address] [place], [place] [address]  Urinalysis, Routine w reflex microscopic -Urine, Clean Catch     Status: Abnormal   Collection Time: 10/20/22 10:09 PM  Result Value Ref Range   Color, Urine YELLOW YELLOW   APPearance CLOUDY (A) CLEAR   Specific Gravity, Urine 1.020 1.005 - 1.030   pH 6.0 5.0 - 8.0   Glucose, UA >=500 (A) NEGATIVE mg/dL   Hgb urine dipstick NEGATIVE NEGATIVE   Bilirubin Urine NEGATIVE NEGATIVE   Ketones, ur NEGATIVE NEGATIVE mg/dL   Protein, ur NEGATIVE NEGATIVE mg/dL   Nitrite NEGATIVE NEGATIVE   Leukocytes,Ua TRACE (A) NEGATIVE    Comment: Performed at [facility], [address] [place], [place] [address]  Urinalysis, Microscopic (reflex)     Status: Abnormal   Collection Time: 10/20/22 10:09 PM  Result Value Ref Range   RBC / HPF 0-5 0 - 5 RBC/hpf   WBC, UA 6-10 0 - 5 WBC/hpf   Bacteria, UA RARE (A) NONE SEEN   Squamous Epithelial / HPF 0-5 0 - 5 /HPF   Mucus PRESENT    Budding Yeast PRESENT    Hyaline Casts, UA PRESENT     Comment: Performed at [facility], [address] [place], [place] [address]  Troponin I (High Sensitivity)     Status: Abnormal   Collection Time: 10/20/22 10:45 PM  Result Value Ref Range   Troponin I (High  Sensitivity) 81 (H) <18 ng/L    Comment: (NOTE) Elevated high sensitivity troponin I (hsTnI) values and significant  changes across serial measurements may suggest ACS but many other  chronic  and acute conditions are known to elevate hsTnI results.  Refer to the "Links" section for chest pain algorithms and additional  guidance. Performed at [facility], [address] [place], [place] [address]    CT Angio Chest PE W and/or Wo Contrast  Result Date: 10/20/2022 CLINICAL DATA:  Shortness of breath EXAM: CT ANGIOGRAPHY CHEST WITH CONTRAST TECHNIQUE: Multidetector CT imaging of the chest was performed using the standard protocol during bolus administration of intravenous contrast. Multiplanar CT image reconstructions and MIPs were obtained to evaluate the vascular anatomy. RADIATION DOSE REDUCTION: This exam was performed according to the departmental dose-optimization program which includes automated exposure control, adjustment of the mA and/or kV according to patient size and/or use of iterative reconstruction technique. CONTRAST:  75mL OMNIPAQUE IOHEXOL 350 MG/ML SOLN COMPARISON:  Chest x-ray 10/20/2022, thoracic spine CT 04/02/2014 FINDINGS: Cardiovascular: Satisfactory opacification of the pulmonary arteries to the segmental level. Positive for acute pulmonary embolus within right upper lobe segmental and subsegmental vessels as well as right lower lobe segmental and subsegmental pulmonary vessels. Small volume thrombus within the distal descending right pulmonary artery. Small volume emboli within left lower lobe distal segmental/subsegmental vessels. Tiny thrombus in subsegmental lingular vessel. borderline RV LV ratio of 1.0. Moderate aortic atherosclerosis. No aortic aneurysm. Coronary vascular calcification. Cardiomegaly. No pericardial effusion. Mediastinum/Nodes: Midline trachea. No suspicious thyroid mass. No suspicious lymph nodes. Esophagus within normal limits. Lungs/Pleura: No  consolidation or effusion. Heterogeneous bilateral ground-glass densities. Upper Abdomen: No acute finding. Musculoskeletal: Moderate severe compression deformities T6, and T12 with mild superior endplate deformity at T5 of uncertain age. Review of the MIP images confirms the above findings. IMPRESSION: 1. Positive for acute pulmonary embolus with small volume emboli within right upper lobe, right lower lobe, and left lower lobe segmental and subsegmental vessels. Borderline RV LV ratio of 1.0. 2. Cardiomegaly. Heterogeneous bilateral ground-glass densities, question edema or atypical infection. 3. Aortic atherosclerosis. Critical Value/emergent results were called by telephone at the time of interpretation on 10/20/2022 at 11:48 pm to provider Dr. Pilar Plate, Who verbally acknowledged these results. Aortic Atherosclerosis (ICD10-I70.0). Electronically Signed   By: Jasmine Pang M.D.   On: 10/20/2022 23:48   DG Chest Portable 1 View  Result Date: 10/20/2022 CLINICAL DATA:  Shortness of breath. EXAM: PORTABLE CHEST 1 VIEW COMPARISON:  02/15/2018. FINDINGS: Heart is enlarged and the mediastinal contour is within normal limits. There is atherosclerotic calcification of the aorta. Diffuse interstitial prominence is noted bilaterally. No consolidation, effusion, or pneumothorax. No acute osseous abnormality. IMPRESSION: Diffuse interstitial prominence bilaterally which may in part be chronic versus edema or infiltrate. Electronically Signed   By: Thornell Sartorius M.D.   On: 10/20/2022 21:47    Pending Labs Unresulted Labs (From admission, onward)     Start     Ordered   10/22/22 0500  Heparin level (unfractionated)  Daily,   R      10/21/22 0003   10/21/22 0800  Heparin level (unfractionated)  Once-Timed,   URGENT        10/21/22 0003   10/21/22 0800  CBC  Daily,   R      10/21/22 0003   10/21/22 0500  Basic metabolic panel  Tomorrow morning,   R        10/21/22 0043   10/21/22 0039  Magnesium  Add-on,   AD         10/21/22 0038            Vitals/Pain Today's Vitals  10/20/22 2135 10/20/22 2200 10/20/22 2230  BP:  109/60 109/65  Resp:  17 20  Weight: 93.6 kg    Height: 5\' 3"  (1.6 m)    PainSc: 0-No pain      Isolation Precautions No active isolations  Medications Medications  potassium chloride 10 mEq in 100 mL IVPB (10 mEq Intravenous New Bag/Given 10/21/22 0048)  heparin ADULT infusion 100 units/mL (25000 units/2106mL) (1,200 Units/hr Intravenous New Bag/Given 10/21/22 0025)  sodium chloride flush (NS) 0.9 % injection 3 mL (has no administration in time range)  acetaminophen (TYLENOL) tablet 650 mg (has no administration in time range)    Or  acetaminophen (TYLENOL) suppository 650 mg (has no administration in time range)  carbamide peroxide (DEBROX) 6.5 % OTIC (EAR) solution 5 drop (has no administration in time range)  ondansetron (ZOFRAN) tablet 4 mg (has no administration in time range)    Or  ondansetron (ZOFRAN) injection 4 mg (has no administration in time range)  senna-docusate (Senokot-S) tablet 1 tablet (has no administration in time range)  potassium chloride (KLOR-CON) packet 40 mEq (has no administration in time range)  iohexol (OMNIPAQUE) 350 MG/ML injection 75 mL (75 mLs Intravenous Contrast Given 10/20/22 2258)  heparin bolus via infusion 4,000 Units (4,000 Units Intravenous Bolus from Bag 10/21/22 0026)    Mobility walks with device     Focused Assessments Pulmonary Assessment Handoff:  Lung sounds:          R Recommendations: See Admitting Provider Note  Report given to:   Additional Notes: n/a

## 2022-10-21 NOTE — Discharge Instructions (Addendum)
Information on my medicine - ELIQUIS (apixaban)  This medication education was reviewed with me or my healthcare representative as part of my discharge preparation.    Why was Eliquis prescribed for you? Eliquis was prescribed to treat blood clots that may have been found in the veins of your legs (deep vein thrombosis) or in your lungs (pulmonary embolism) and to reduce the risk of them occurring again.  What do You need to know about Eliquis ? The starting dose is 10 mg (two 5 mg tablets) taken TWICE daily for the FIRST SEVEN (7) DAYS, then on 10/28/22  the dose is reduced to ONE 5 mg tablet taken TWICE daily.  Eliquis may be taken with or without food.   Try to take the dose about the same time in the morning and in the evening. If you have difficulty swallowing the tablet whole please discuss with your pharmacist how to take the medication safely.  Take Eliquis exactly as prescribed and DO NOT stop taking Eliquis without talking to the doctor who prescribed the medication.  Stopping may increase your risk of developing a new blood clot.  Refill your prescription before you run out.  After discharge, you should have regular check-up appointments with your healthcare provider that is prescribing your Eliquis.    What do you do if you miss a dose? If a dose of ELIQUIS is not taken at the scheduled time, take it as soon as possible on the same day and twice-daily administration should be resumed. The dose should not be doubled to make up for a missed dose.  Important Safety Information A possible side effect of Eliquis is bleeding. You should call your healthcare provider right away if you experience any of the following: Bleeding from an injury or your nose that does not stop. Unusual colored urine (red or dark brown) or unusual colored stools (red or black). Unusual bruising for unknown reasons. A serious fall or if you hit your head (even if there is no bleeding).  Some  medicines may interact with Eliquis and might increase your risk of bleeding or clotting while on Eliquis. To help avoid this, consult your healthcare provider or pharmacist prior to using any new prescription or non-prescription medications, including herbals, vitamins, non-steroidal anti-inflammatory drugs (NSAIDs) and supplements.  This website has more information on Eliquis (apixaban): http://www.eliquis.com/eliquis/home  ===========================================  Pulmonary Embolism    A pulmonary embolism (PE) is a sudden blockage or decrease of blood flow in one or both lungs. Most blockages come from a blood clot that forms in the vein of a lower leg, thigh, or arm (deep vein thrombosis, DVT) and travels to the lungs. A clot is blood that has thickened into a gel or solid. PE is a dangerous and life-threatening condition that needs to be treated right away.  What are the causes? This condition is usually caused by a blood clot that forms in a vein and moves to the lungs. In rare cases, it may be caused by air, fat, part of a tumor, or other tissue that moves through the veins and into the lungs.  What increases the risk? The following factors may make you more likely to develop this condition: Experiencing a traumatic injury, such as breaking a hip or leg. Having: A spinal cord injury. Orthopedic surgery, especially hip or knee replacement. Any major surgery. A stroke. DVT. Blood clots or blood clotting disease. Long-term (chronic) lung or heart disease. Cancer treated with chemotherapy. A central venous catheter. Taking  medicines that contain estrogen. These include birth control pills and hormone replacement therapy. Being: Pregnant. In the period of time after your baby is delivered (postpartum). Older than age 81. Overweight. A smoker, especially if you have other risks.  What are the signs or symptoms? Symptoms of this condition usually start suddenly and  include: Shortness of breath during activity or at rest. Coughing, coughing up blood, or coughing up blood-tinged mucus. Chest pain that is often worse with deep breaths. Rapid or irregular heartbeat. Feeling light-headed or dizzy. Fainting. Feeling anxious. Fever. Sweating. Pain and swelling in a leg. This is a symptom of DVT, which can lead to PE. How is this diagnosed? This condition may be diagnosed based on: Your medical history. A physical exam. Blood tests. CT pulmonary angiogram. This test checks blood flow in and around your lungs. Ventilation-perfusion scan, also called a lung VQ scan. This test measures air flow and blood flow to the lungs. An ultrasound of the legs.  How is this treated? Treatment for this condition depends on many factors, such as the cause of your PE, your risk for bleeding or developing more clots, and other medical conditions you have. Treatment aims to remove, dissolve, or stop blood clots from forming or growing larger. Treatment may include: Medicines, such as: Blood thinning medicines (anticoagulants) to stop clots from forming. Medicines that dissolve clots (thrombolytics). Procedures, such as: Using a flexible tube to remove a blood clot (embolectomy) or to deliver medicine to destroy it (catheter-directed thrombolysis). Inserting a filter into a large vein that carries blood to the heart (inferior vena cava). This filter (vena cava filter) catches blood clots before they reach the lungs. Surgery to remove the clot (surgical embolectomy). This is rare. You may need a combination of immediate, long-term (up to 3 months after diagnosis), and extended (more than 3 months after diagnosis) treatments. Your treatment may continue for several months (maintenance therapy). You and your health care provider will work together to choose the treatment program that is best for you.  Follow these instructions at home: Medicines Take over-the-counter and  prescription medicines only as told by your health care provider. If you are taking an anticoagulant medicine: Take the medicine every day at the same time each day. Understand what foods and drugs interact with your medicine. Understand the side effects of this medicine, including excessive bruising or bleeding. Ask your health care provider or pharmacist about other side effects.  General instructions Wear a medical alert bracelet or carry a medical alert card that says you have had a PE and lists what medicines you take. Ask your health care provider when you may return to your normal activities. Avoid sitting or lying for a long time without moving. Maintain a healthy weight. Ask your health care provider what weight is healthy for you. Do not use any products that contain nicotine or tobacco, such as cigarettes, e-cigarettes, and chewing tobacco. If you need help quitting, ask your health care provider. Talk with your health care provider about any travel plans. It is important to make sure that you are still able to take your medicine while on trips. Keep all follow-up visits as told by your health care provider. This is important.  Contact a health care provider if: You missed a dose of your blood thinner medicine.  Get help right away if: You have: New or increased pain, swelling, warmth, or redness in an arm or leg. Numbness or tingling in an arm or leg. Shortness of  breath during activity or at rest. A fever. Chest pain. A rapid or irregular heartbeat. A severe headache. Vision changes. A serious fall or accident, or you hit your head. Stomach (abdominal) pain. Blood in your vomit, stool, or urine. A cut that will not stop bleeding. You cough up blood. You feel light-headed or dizzy. You cannot move your arms or legs. You are confused or have memory loss.  These symptoms may represent a serious problem that is an emergency. Do not wait to see if the symptoms will go  away. Get medical help right away. Call your local emergency services (911 in the [place] Do not drive yourself to the hospital. Summary A pulmonary embolism (PE) is a sudden blockage or decrease of blood flow in one or both lungs. PE is a dangerous and life-threatening condition that needs to be treated right away. Treatments for this condition usually include medicines to thin your blood (anticoagulants) or medicines to break apart blood clots (thrombolytics). If you are given blood thinners, it is important to take the medicine every day at the same time each day. Understand what foods and drugs interact with any medicines that you are taking. If you have signs of PE or DVT, call your local emergency services (911 in the [place] This information is not intended to replace advice given to you by your health care provider. Make sure you discuss any questions you have with your health care provider. Document Revised: 01/10/2018 Document Reviewed: 01/10/2018 Elsevier Patient Education  2020 [employer].  ======================================================

## 2022-10-21 NOTE — H&P (Signed)
History and Physical    Carolyn Mills:[id number] DOB: 1941/03/23 DOA: 10/20/2022  PCP: Pcp, No  Patient coming from: Home  I have personally briefly reviewed patient's old medical records in [facility]  Chief Complaint: Shortness of breath  HPI: Carolyn Mills is a 82 y.o. female with medical history significant for chronic HFpEF (EF 50%), pHTN, chronic respiratory failure with hypoxia on 2 L O2 via [place], CKD stage IV, history of PE (taken off Eliquis 2 months ago), anemia of chronic disease, RA, HTN, HLD who presented to the ED for evaluation of shortness of breath.  Patient was previously hospitalized at OSH from 4/30-5/5 for decompensated CHF.  She was aggressively diuresed with reportedly 40 pounds off.  She was previously on Eliquis for prior PE which was discontinued due to her anemia.  She was also started on 2 L supplemental O2 via [place] which she has been using at all times since her admission.  She lives in [place] but has been staying with family locally for about 2 weeks.  She had been doing well until earlier on 7/4 when she developed worsening shortness of breath despite using her 2 L O2 via [place].  She did not have any associated chest pain, cough, nausea, vomiting, abdominal pain.  She and family have not noticed any increasing swelling in her lower extremities.  She has not seen any obvious bleeding.    ED Course  Labs/Imaging on admission: I have personally reviewed following labs and imaging studies.  Initial vitals showed BP 126/67, pulse 78, RR 23, temperature not recorded, SpO2 100% on 3 L O2 via [place].  Labs show WBC 5.7, hemoglobin 8.1, platelets 249,000, sodium 134, potassium 3.0, bicarb 25, BUN 26, creatinine 1.47, serum glucose 141, BNP 466.4, troponin 81.  CTA chest positive for acute PE with small volume emboli within RUL, RLL, and LLL segmental and subsegmental vessels.  Cardiomegaly and heterogeneous bilateral groundglass densities noted.  Patient was  given IV K 10 mEq x 2 and started on IV heparin.  The hospitalist service was consulted to admit for further evaluation and management.  Review of Systems: All systems reviewed and are negative except as documented in history of present illness above.   Past Medical History:  Diagnosis Date   Abnormal findings on diagnostic imaging of breast    Anemia of chronic disease    Arthritis    Body mass index 40.0-44.9, adult (HCC)    Burning pain    Chest pain    Chronic heart failure with preserved ejection fraction (HFpEF) (HCC) 10/21/2022   Chronic respiratory failure with hypoxia (HCC) 10/21/2022   CKD (chronic kidney disease), stage IV (HCC) 10/21/2022   Encounter for screening for malignant neoplasm of breast    Essential hypertension 10/21/2022   GERD (gastroesophageal reflux disease)    HLD (hyperlipidemia)    HTN (hypertension)    Hyperlipemia    Hyperlipidemia 10/21/2022   Idiopathic non-specific interstitial pneumonitis (HCC)    Long term (current) use of anticoagulants    Long term use of drug    Lymphedema    Mild coronary artery disease    Morbid obesity due to excess calories (HCC)    Osteoporosis    Pulmonary embolism (HCC)    Pulmonary hypertension (HCC) 10/21/2022   Pure hypercholesterolemia    Rheumatoid arthritis (HCC)    Screen for colon cancer    Shortness of breath    Solitary lung nodule    Tubular adenoma  of colon    Vitamin D deficiency     Past Surgical History:  Procedure Laterality Date   CARDIAC CATHETERIZATION     CARPAL TUNNEL RELEASE Right    TOE SURGERY Bilateral     Social History:  reports that she has never smoked. She has never used smokeless tobacco. She reports that she does not drink alcohol and does not use drugs.  Allergies  Allergen Reactions   Propoxyphene     CAN take tylenol - unknown reaction   Clindamycin Rash   Doxycycline Rash   Sulfamethoxazole-Trimethoprim Rash    Family History  Problem Relation Age of Onset    Hypertension Mother    Hypertension Sister    Healthy Brother      Prior to Admission medications   Medication Sig Start Date End Date Taking? Authorizing Provider  Cholecalciferol (VITAMIN D-1000 MAX ST) 1000 units tablet Take 2,000 Units by mouth daily.    [provider]  ELIQUIS 5 MG TABS tablet Take 5 mg by mouth 2 (two) times daily. 01/22/18   [provider]  folic acid (FOLVITE) 1 MG tablet Take 1 mg by mouth daily. 01/29/18   [provider]  gabapentin (NEURONTIN) 300 MG capsule Take 300 mg by mouth 3 (three) times daily. 01/30/18   [provider]  hydroxychloroquine (PLAQUENIL) 200 MG tablet Take 200 mg by mouth 2 (two) times daily. 01/23/18   [provider]  ipratropium-albuterol (DUONEB) 0.5-2.5 (3) MG/3ML SOLN Take 3 mLs by nebulization 2 (two) times daily as needed. 02/18/18   Darlin Drop, DO  isosorbide mononitrate (IMDUR) 30 MG 24 hr tablet Take 30 mg by mouth at bedtime. 01/02/18   [provider]  methotrexate (RHEUMATREX) 2.5 MG tablet Take 15 mg by mouth once a week. Takes every Thursday 11/28/17   [provider]  metolazone (ZAROXOLYN) 2.5 MG tablet Take 2.5 mg by mouth 4 (four) times a week. 12/19/17   [provider]  metoprolol tartrate (LOPRESSOR) 25 MG tablet Take 25 mg by mouth 2 (two) times daily. 02/08/18   [provider]  Multiple Vitamin (MULTI-VITAMINS) TABS Take 1 tablet by mouth daily.    [provider]  nitroGLYCERIN (NITROSTAT) 0.4 MG SL tablet Place 0.4 mg under the tongue every 5 (five) minutes as needed for chest pain.  12/19/17   [provider]  Omega-3 Fatty Acids (FISH OIL) 1000 MG CAPS Take 1,000 mg by mouth 2 (two) times daily.    [provider]  pantoprazole (PROTONIX) 40 MG tablet Take 40 mg by mouth 2 (two) times daily. 02/09/18   [provider]  potassium chloride (MICRO-K) 10 MEQ CR capsule Take 10 mEq by mouth 2 (two) times  daily. 01/02/18   [provider]  predniSONE (DELTASONE) 5 MG tablet Take 5 mg by mouth every other day. 01/29/18   [provider]  rosuvastatin (CRESTOR) 20 MG tablet Take 20 mg by mouth at bedtime. 12/19/17   [provider]  vitamin B-12 (CYANOCOBALAMIN) 1000 MCG tablet Take 1,000 mcg by mouth daily.    [provider]    Physical Exam: Vitals:   10/20/22 2135 10/20/22 2200 10/20/22 2230  BP:  109/60 109/65  Resp:  17 20  Weight: 93.6 kg    Height: 5\' 3"  (1.6 m)     Constitutional: Thin elderly woman resting in bed, NAD, calm, comfortable Eyes: EOMI, lids and conjunctivae normal ENMT: Mucous membranes are moist. Posterior pharynx clear of  any exudate or lesions.Normal dentition.  Cerumen bilateral ears. Neck: normal, supple, no masses. Respiratory: Bibasilar inspiratory crackles. Normal respiratory effort. No accessory muscle use.  Cardiovascular: Regular rate and rhythm, no murmurs / rubs / gallops.  Chronic lymphedema changes both legs with no pretibial pitting edema. 2+ pedal pulses. Abdomen: no tenderness, no masses palpated.  Musculoskeletal: no clubbing / cyanosis. No joint deformity upper and lower extremities. Good ROM, no contractures. Normal muscle tone.  Skin: no rashes, lesions, ulcers. No induration Neurologic: Sensation intact. Strength 5/5 in all 4.  Psychiatric: Normal judgment and insight. Alert and oriented x 3. Normal mood.   EKG: Personally reviewed. Sinus rhythm, rate 75, PVC present.  QTc 513.  No acute ischemic changes.  Assessment/Plan Principal Problem:   Acute pulmonary embolism (HCC) Active Problems:   Chronic heart failure with preserved ejection fraction (HFpEF) (HCC)   Chronic respiratory failure with hypoxia (HCC)   CKD (chronic kidney disease), stage IV (HCC)   Anemia of chronic disease   Hypokalemia   Pulmonary hypertension (HCC)   Essential hypertension   Hyperlipidemia   Rheumatoid arthritis (HCC)    Carolyn Mills is a 82 y.o. female with medical history significant for chronic HFpEF (EF 50%), pHTN, chronic respiratory failure with hypoxia on 2 L O2 via [place], CKD stage IV, history of PE (taken off Eliquis 2 months ago), anemia of chronic disease, RA, HTN, HLD who is admitted with acute pulmonary emboli.  Assessment and Plan: Acute pulmonary emboli, recurrent: Previously on anticoagulation with Eliquis which was discontinued about 2 months ago due to her anemia.  Per family and patient no recent GI bleeding.  Likely will need lifelong anticoagulation as tolerated due to recurrent PE. -Started on IV heparin per pharmacy -Potentially can transition to oral anticoagulant tomorrow -Continue supportive O2 as needed -Monitor for signs/symptoms of bleeding  Chronic HFpEF: Overall appears euvolemic.  Last EF 50% 08/17/2022. -Holding spironolactone due to soft blood pressure -Continue Farxiga -Monitor I/O's and daily weights  Chronic respiratory failure with hypoxia: Requiring increased 3 L supplemental O2 via [place] compared to baseline at 2 L [place] in setting of acute PE.  Wean O2 as able.  CKD stage IV: Renal function stable and improved from prior labs 1 month ago.  Continue to monitor.  Anemia of chronic disease: Hemoglobin stable at 8.1.  No obvious bleeding, monitor while on anticoagulation.  Hypokalemia: Oral and IV supplement ordered.  Pulmonary hypertension: Continue tafamidis.  Hypertension: Holding spironolactone as above.  Hyperlipidemia: Continue rosuvastatin.  Rheumatoid arthritis: Continue Plaquenil.   DVT prophylaxis: Heparin drip Code Status: Full code, discussed with patient and family at bedside Family Communication: Son and granddaughter at bedside Disposition Plan: From home and likely discharge to home pending clinical progress Consults called: None Severity of Illness: The appropriate patient status for this patient is OBSERVATION. Observation status is  judged to be reasonable and necessary in order to provide the required intensity of service to ensure the patient's safety. The patient's presenting symptoms, physical exam findings, and initial radiographic and laboratory data in the context of their medical condition is felt to place them at decreased risk for further clinical deterioration. Furthermore, it is anticipated that the patient will be medically stable for discharge from the hospital within 2 midnights of admission.   Darreld Mclean MD [facility]  If 7PM-7AM, please contact night-coverage [email or web address]  10/21/2022, 12:51 AM

## 2022-10-21 NOTE — Hospital Course (Signed)
Carolyn Mills is a 82 y.o. female with medical history significant for chronic HFpEF (EF 50%), pHTN, chronic respiratory failure with hypoxia on 2 L O2 via [place], CKD stage IV, history of PE (taken off Eliquis 2 months ago), anemia of chronic disease, RA, HTN, HLD who is admitted with acute pulmonary emboli.

## 2022-10-21 NOTE — Progress Notes (Signed)
PROGRESS NOTE    Carolyn Mills  ZOX:[id number] DOB: 01-Aug-1940 DOA: 10/20/2022 PCP: Pcp, No    Brief Narrative:  82 year old with history of chronic diastolic dysfunction with known ejection fraction of 50%, recent admission at [facility] for fluid overload, essential hypertension, chronic respiratory failure on 2 L oxygen at home, CKD stage IV, history of PE and recently taken off Eliquis as she completed therapy, anemia of chronic disease who is visiting her granddaughter who noted patient being short of breath and fatigue so brought to the ER. In the emergency room on 3 L oxygen.  Hemoglobin 8.1.  Potassium 3.  CTA positive for small right upper lobe and right lower lobe segmental PE.  Admitted with heparin infusion.   Assessment & Plan:   Acute recurrent pulmonary embolism: Apparently Eliquis discontinued 2 months ago.  Patient does not have history of active bleeding or life-threatening bleeding.  She has chronic anemia.  She would likely benefit with ongoing Eliquis with close monitoring of hemoglobin.  On heparin overnight. Start Eliquis loading dose and maintenance dose.  Will request outpatient follow-up.  Anemia of chronic disease, acute on chronic.  Reviewed baseline hemoglobin about 7-9.  No evidence of active bleeding.  Likely due to CKD. Hemoglobin 6.1.  Need for anticoagulation. 2 units PRBC transfusion today, patient and family consented.  She will need close monitoring of hemoglobin on Eliquis.  Chronic medical issues including Chronic heart failure with preserved ejection fraction: Euvolemic.  Resume spironolactone, Marcelline Deist. Chronic hypoxemic respiratory failure: At about her baseline.  On 2 to 3 L of oxygen. CKD stage IV: At about her baseline. Hypokalemia: Replaced.  Adequate.  Resume spironolactone. Pulm hypertension: Continue tafamidis. Rheumatoid arthritis: On Plaquenil.  Continue.   DVT prophylaxis:  apixaban (ELIQUIS) tablet 10 mg  apixaban (ELIQUIS) tablet  5 mg   Code Status: Full code Family Communication: Granddaughter on the phone Disposition Plan: Status is: Observation The patient will require care spanning > 2 midnights and should be moved to inpatient because: Blood transfusion, monitoring for bleeding     Consultants:  None  Procedures:  None  Antimicrobials:  None   Subjective: Patient seen and examined.  Poor historian.  She is pleasant to conversation.  She defers her questions to her granddaughter.  Denies any complaints.  She thinks she is more tired than usual.  Objective: Vitals:   10/21/22 0100 10/21/22 0152 10/21/22 0423 10/21/22 0849  BP: (!) 106/53 (!) 99/54 (!) 101/48 (!) 101/52  Pulse: 72 75 86   Resp: 12 15 20    Temp:  98.7 F (37.1 C) 98 F (36.7 C) 97.9 F (36.6 C)  TempSrc:  Oral Oral Oral  SpO2: 100% 100% 100%   Weight:   54.8 kg   Height:        Intake/Output Summary (Last 24 hours) at 10/21/2022 1019 Last data filed at 10/21/2022 0730 Gross per 24 hour  Intake 635.74 ml  Output 450 ml  Net 185.74 ml   Filed Weights   10/20/22 2135 10/21/22 0423  Weight: 93.6 kg 54.8 kg    Examination:  General exam: Appears calm and comfortable, on 2 L oxygen. Respiratory system: Clear to auscultation. Respiratory effort normal.  No added sounds. Cardiovascular system: S1 & S2 heard, RRR.  Chronic nonpitting edema of the legs. Gastrointestinal system: Abdomen is nondistended, soft and nontender. No organomegaly or masses felt. Normal bowel sounds heard. Central nervous system: Alert and oriented. No focal neurological deficits. Extremities: Symmetric 5 x 5  power. Skin: No rashes, lesions or ulcers Psychiatry: Judgement and insight appear normal. Mood & affect appropriate.     Data Reviewed: I have personally reviewed following labs and imaging studies  CBC: Recent Labs  Lab 10/20/22 2135 10/21/22 0726  WBC 5.7 4.7  HGB 8.1* 6.1*  HCT 26.0* 19.5*  MCV 99.2 98.0  PLT 249 213   Basic  Metabolic Panel: Recent Labs  Lab 10/20/22 2135 10/21/22 0030 10/21/22 0726  NA 134*  --  136  K 3.0*  --  3.7  CL 96*  --  101  CO2 25  --  26  GLUCOSE 141*  --  84  BUN 26*  --  23  CREATININE 1.47*  --  1.32*  CALCIUM 8.7*  --  8.2*  MG  --  1.9  --    GFR: Estimated Creatinine Clearance: 27.7 mL/min (A) (by C-G formula based on SCr of 1.32 mg/dL (H)). Liver Function Tests: No results for input(s): "AST", "ALT", "ALKPHOS", "BILITOT", "PROT", "ALBUMIN" in the last 168 hours. No results for input(s): "LIPASE", "AMYLASE" in the last 168 hours. No results for input(s): "AMMONIA" in the last 168 hours. Coagulation Profile: No results for input(s): "INR", "PROTIME" in the last 168 hours. Cardiac Enzymes: No results for input(s): "CKTOTAL", "CKMB", "CKMBINDEX", "TROPONINI" in the last 168 hours. BNP (last 3 results) No results for input(s): "PROBNP" in the last 8760 hours. HbA1C: No results for input(s): "HGBA1C" in the last 72 hours. CBG: No results for input(s): "GLUCAP" in the last 168 hours. Lipid Profile: No results for input(s): "CHOL", "HDL", "LDLCALC", "TRIG", "CHOLHDL", "LDLDIRECT" in the last 72 hours. Thyroid Function Tests: No results for input(s): "TSH", "T4TOTAL", "FREET4", "T3FREE", "THYROIDAB" in the last 72 hours. Anemia Panel: No results for input(s): "VITAMINB12", "FOLATE", "FERRITIN", "TIBC", "IRON", "RETICCTPCT" in the last 72 hours. Sepsis Labs: No results for input(s): "PROCALCITON", "LATICACIDVEN" in the last 168 hours.  No results found for this or any previous visit (from the past 240 hour(s)).       Radiology Studies: CT Angio Chest PE W and/or Wo Contrast  Result Date: 10/20/2022 CLINICAL DATA:  Shortness of breath EXAM: CT ANGIOGRAPHY CHEST WITH CONTRAST TECHNIQUE: Multidetector CT imaging of the chest was performed using the standard protocol during bolus administration of intravenous contrast. Multiplanar CT image reconstructions and MIPs  were obtained to evaluate the vascular anatomy. RADIATION DOSE REDUCTION: This exam was performed according to the departmental dose-optimization program which includes automated exposure control, adjustment of the mA and/or kV according to patient size and/or use of iterative reconstruction technique. CONTRAST:  75mL OMNIPAQUE IOHEXOL 350 MG/ML SOLN COMPARISON:  Chest x-ray 10/20/2022, thoracic spine CT 04/02/2014 FINDINGS: Cardiovascular: Satisfactory opacification of the pulmonary arteries to the segmental level. Positive for acute pulmonary embolus within right upper lobe segmental and subsegmental vessels as well as right lower lobe segmental and subsegmental pulmonary vessels. Small volume thrombus within the distal descending right pulmonary artery. Small volume emboli within left lower lobe distal segmental/subsegmental vessels. Tiny thrombus in subsegmental lingular vessel. borderline RV LV ratio of 1.0. Moderate aortic atherosclerosis. No aortic aneurysm. Coronary vascular calcification. Cardiomegaly. No pericardial effusion. Mediastinum/Nodes: Midline trachea. No suspicious thyroid mass. No suspicious lymph nodes. Esophagus within normal limits. Lungs/Pleura: No consolidation or effusion. Heterogeneous bilateral ground-glass densities. Upper Abdomen: No acute finding. Musculoskeletal: Moderate severe compression deformities T6, and T12 with mild superior endplate deformity at T5 of uncertain age. Review of the MIP images confirms the above findings. IMPRESSION: 1. Positive  for acute pulmonary embolus with small volume emboli within right upper lobe, right lower lobe, and left lower lobe segmental and subsegmental vessels. Borderline RV LV ratio of 1.0. 2. Cardiomegaly. Heterogeneous bilateral ground-glass densities, question edema or atypical infection. 3. Aortic atherosclerosis. Critical Value/emergent results were called by telephone at the time of interpretation on 10/20/2022 at 11:48 pm to provider Dr.  Pilar Plate, Who verbally acknowledged these results. Aortic Atherosclerosis (ICD10-I70.0). Electronically Signed   By: Jasmine Pang M.D.   On: 10/20/2022 23:48   DG Chest Portable 1 View  Result Date: 10/20/2022 CLINICAL DATA:  Shortness of breath. EXAM: PORTABLE CHEST 1 VIEW COMPARISON:  02/15/2018. FINDINGS: Heart is enlarged and the mediastinal contour is within normal limits. There is atherosclerotic calcification of the aorta. Diffuse interstitial prominence is noted bilaterally. No consolidation, effusion, or pneumothorax. No acute osseous abnormality. IMPRESSION: Diffuse interstitial prominence bilaterally which may in part be chronic versus edema or infiltrate. Electronically Signed   By: Thornell Sartorius M.D.   On: 10/20/2022 21:47        Scheduled Meds:  apixaban  10 mg Oral BID   Followed by   Melene Muller ON 10/28/2022] apixaban  5 mg Oral BID   carbamide peroxide  5 drop Both EARS BID   cyanocobalamin  1,000 mcg Oral Daily   dapagliflozin propanediol  10 mg Oral QHS   docusate sodium  100 mg Oral Daily   ferrous sulfate  325 mg Oral Q breakfast   folic acid  1 mg Oral Daily   hydroxychloroquine  200 mg Oral BID   rosuvastatin  20 mg Oral QHS   sodium chloride flush  3 mL Intravenous Q12H   Tafamidis  61 mg Oral Daily   Continuous Infusions:   LOS: 0 days    Time spent: 35 minutes.  Same-day admit.  No charge visit.    Dorcas Carrow, MD [facility]

## 2022-10-21 NOTE — Plan of Care (Signed)

## 2022-10-21 NOTE — TOC Benefit Eligibility Note (Signed)
Pharmacy Patient Advocate Encounter  Insurance verification completed.    The patient is insured through  [employer] with pharmacist at [facility], and was told Eliquis copay would be $107.23   This test claim was processed through [facility]- copay amounts may vary at other pharmacies due to [employer], or as the patient moves through the different stages of their insurance plan.

## 2022-10-22 DIAGNOSIS — M1990 Unspecified osteoarthritis, unspecified site: Secondary | ICD-10-CM | POA: Diagnosis present

## 2022-10-22 DIAGNOSIS — N184 Chronic kidney disease, stage 4 (severe): Secondary | ICD-10-CM | POA: Diagnosis present

## 2022-10-22 DIAGNOSIS — E876 Hypokalemia: Secondary | ICD-10-CM | POA: Diagnosis present

## 2022-10-22 DIAGNOSIS — I2510 Atherosclerotic heart disease of native coronary artery without angina pectoris: Secondary | ICD-10-CM | POA: Diagnosis present

## 2022-10-22 DIAGNOSIS — Z86711 Personal history of pulmonary embolism: Secondary | ICD-10-CM | POA: Diagnosis not present

## 2022-10-22 DIAGNOSIS — I272 Pulmonary hypertension, unspecified: Secondary | ICD-10-CM | POA: Diagnosis present

## 2022-10-22 DIAGNOSIS — I5032 Chronic diastolic (congestive) heart failure: Secondary | ICD-10-CM | POA: Diagnosis not present

## 2022-10-22 DIAGNOSIS — R911 Solitary pulmonary nodule: Secondary | ICD-10-CM | POA: Diagnosis present

## 2022-10-22 DIAGNOSIS — I130 Hypertensive heart and chronic kidney disease with heart failure and stage 1 through stage 4 chronic kidney disease, or unspecified chronic kidney disease: Secondary | ICD-10-CM | POA: Diagnosis present

## 2022-10-22 DIAGNOSIS — I2694 Multiple subsegmental pulmonary emboli without acute cor pulmonale: Secondary | ICD-10-CM | POA: Diagnosis present

## 2022-10-22 DIAGNOSIS — E7800 Pure hypercholesterolemia, unspecified: Secondary | ICD-10-CM | POA: Diagnosis present

## 2022-10-22 DIAGNOSIS — M069 Rheumatoid arthritis, unspecified: Secondary | ICD-10-CM | POA: Diagnosis present

## 2022-10-22 DIAGNOSIS — I5042 Chronic combined systolic (congestive) and diastolic (congestive) heart failure: Secondary | ICD-10-CM | POA: Diagnosis present

## 2022-10-22 DIAGNOSIS — R634 Abnormal weight loss: Secondary | ICD-10-CM | POA: Diagnosis present

## 2022-10-22 DIAGNOSIS — L89152 Pressure ulcer of sacral region, stage 2: Secondary | ICD-10-CM | POA: Diagnosis present

## 2022-10-22 DIAGNOSIS — R627 Adult failure to thrive: Secondary | ICD-10-CM | POA: Diagnosis present

## 2022-10-22 DIAGNOSIS — I2699 Other pulmonary embolism without acute cor pulmonale: Secondary | ICD-10-CM | POA: Diagnosis present

## 2022-10-22 DIAGNOSIS — Z7952 Long term (current) use of systemic steroids: Secondary | ICD-10-CM | POA: Diagnosis not present

## 2022-10-22 DIAGNOSIS — I890 Lymphedema, not elsewhere classified: Secondary | ICD-10-CM | POA: Diagnosis present

## 2022-10-22 DIAGNOSIS — D631 Anemia in chronic kidney disease: Secondary | ICD-10-CM | POA: Diagnosis present

## 2022-10-22 DIAGNOSIS — J9611 Chronic respiratory failure with hypoxia: Secondary | ICD-10-CM | POA: Diagnosis present

## 2022-10-22 DIAGNOSIS — Z79899 Other long term (current) drug therapy: Secondary | ICD-10-CM | POA: Diagnosis not present

## 2022-10-22 DIAGNOSIS — R0602 Shortness of breath: Secondary | ICD-10-CM | POA: Diagnosis present

## 2022-10-22 DIAGNOSIS — D638 Anemia in other chronic diseases classified elsewhere: Secondary | ICD-10-CM | POA: Diagnosis not present

## 2022-10-22 DIAGNOSIS — M810 Age-related osteoporosis without current pathological fracture: Secondary | ICD-10-CM | POA: Diagnosis present

## 2022-10-22 DIAGNOSIS — I2692 Saddle embolus of pulmonary artery without acute cor pulmonale: Secondary | ICD-10-CM | POA: Diagnosis not present

## 2022-10-22 DIAGNOSIS — Z7901 Long term (current) use of anticoagulants: Secondary | ICD-10-CM | POA: Diagnosis not present

## 2022-10-22 DIAGNOSIS — L8990 Pressure ulcer of unspecified site, unspecified stage: Secondary | ICD-10-CM | POA: Insufficient documentation

## 2022-10-22 DIAGNOSIS — Z9981 Dependence on supplemental oxygen: Secondary | ICD-10-CM | POA: Diagnosis not present

## 2022-10-22 LAB — BPAM RBC
Blood Product Expiration Date: 202407122359
Blood Product Expiration Date: 202407282359
ISSUE DATE / TIME: 202407051015
ISSUE DATE / TIME: 202407051301
Unit Type and Rh: 6200
Unit Type and Rh: 6200

## 2022-10-22 LAB — CBC
HCT: 23.5 % — ABNORMAL LOW (ref 36.0–46.0)
Hemoglobin: 7.5 g/dL — ABNORMAL LOW (ref 12.0–15.0)
MCH: 30.2 pg (ref 26.0–34.0)
MCHC: 31.9 g/dL (ref 30.0–36.0)
MCV: 94.8 fL (ref 80.0–100.0)
Platelets: 195 10*3/uL (ref 150–400)
RBC: 2.48 MIL/uL — ABNORMAL LOW (ref 3.87–5.11)
RDW: 16.7 % — ABNORMAL HIGH (ref 11.5–15.5)
WBC: 5.6 10*3/uL (ref 4.0–10.5)
nRBC: 0.4 % — ABNORMAL HIGH (ref 0.0–0.2)

## 2022-10-22 LAB — TYPE AND SCREEN
Antibody Screen: NEGATIVE
Unit division: 0
Unit division: 0

## 2022-10-22 MED ORDER — SODIUM CHLORIDE 0.9 % IV SOLN
200.0000 mg | INTRAVENOUS | Status: AC
  Administered 2022-10-22 – 2022-10-24 (×3): 200 mg via INTRAVENOUS
  Filled 2022-10-22 (×3): qty 10

## 2022-10-22 NOTE — Evaluation (Signed)
Occupational Therapy Evaluation Patient Details Name: Carolyn Mills MRN: [id number] DOB: Oct 28, 1940 Today's Date: 10/22/2022   History of Present Illness Pt is an 82 year old woman from [place], [place] who has been staying with her granddaughter x 2 weeks due to continued decline in functional status/weakness and inability for her husband to care for her at home. On 7/4 pt had an abrupt onset of shortness of breath. CT+ multiple pulmonary emboli. PMH: RA, CAD, PE, HTN, HLD, CHF (EF 55%), chronic respiratory failure on 2L O2, CKD IV, LE edema.   Clinical Impression   Pt has had a significant decline in ability to care for herself an mobilize since March when she was admitted at a hospital near her home and 40 lbs of fluid was diuresed. Pt has been needing assist for ambulation with rollator, for ADLs and IADLs at her granddaughter's home. Pt presents with generalized weakness, decreased activity tolerance and impaired standing balance. She requires min guard assist for short distance mobility with RW and set up to min assist for ADLs. SpO2 noted to remain >90% on RA this activity. Patient will benefit from continued inpatient follow up therapy, <3 hours/day. Pt and family are in agreement.      Recommendations for follow up therapy are one component of a multi-disciplinary discharge planning process, led by the attending physician.  Recommendations may be updated based on patient status, additional functional criteria and insurance authorization.   Assistance Recommended at Discharge Frequent or constant Supervision/Assistance  Patient can return home with the following A little help with walking and/or transfers;A little help with bathing/dressing/bathroom;Assistance with cooking/housework;Direct supervision/assist for medications management;Direct supervision/assist for financial management;Assist for transportation;Help with stairs or ramp for entrance    Functional Status Assessment  Patient  has had a recent decline in their functional status and demonstrates the ability to make significant improvements in function in a reasonable and predictable amount of time.  Equipment Recommendations  Other (comment) (defer)    Recommendations for Other Services       Precautions / Restrictions Precautions Precautions: Fall Restrictions Weight Bearing Restrictions: No      Mobility Bed Mobility Overal bed mobility: Needs Assistance Bed Mobility: Supine to Sit, Sit to Supine     Supine to sit: Supervision, HOB elevated Sit to supine: Min guard, HOB elevated   General bed mobility comments: uses hands to help LEs back into bed    Transfers Overall transfer level: Needs assistance Equipment used: Rolling walker (2 wheels) Transfers: Sit to/from Stand Sit to Stand: Min guard                  Balance Overall balance assessment: Needs assistance   Sitting balance-Leahy Scale: Good       Standing balance-Leahy Scale: Poor Standing balance comment: reliant on RW for ambulation, fair balance for static standing, tires quickly in static standing                           ADL either performed or assessed with clinical judgement   ADL Overall ADL's : Needs assistance/impaired Eating/Feeding: Independent;Bed level   Grooming: Min guard;Standing   Upper Body Bathing: Set up;Sitting   Lower Body Bathing: Minimal assistance;Sit to/from stand   Upper Body Dressing : Minimal assistance;Sitting   Lower Body Dressing: Minimal assistance;Sit to/from stand   Toilet Transfer: Min guard;Ambulation;Rolling walker (2 wheels)   Toileting- Clothing Manipulation and Hygiene: Min guard;Sit to/from stand  Functional mobility during ADLs: Min guard;Rolling walker (2 wheels)       Vision Ability to See in Adequate Light: 0 Adequate Patient Visual Report: No change from baseline       Perception     Praxis      Pertinent Vitals/Pain Pain  Assessment Pain Assessment: Faces Faces Pain Scale: No hurt     Hand Dominance Right   Extremity/Trunk Assessment Upper Extremity Assessment Upper Extremity Assessment: Overall WFL for tasks assessed   Lower Extremity Assessment Lower Extremity Assessment: Defer to PT evaluation   Cervical / Trunk Assessment Cervical / Trunk Assessment: Kyphotic;Other exceptions (forward head)   Communication Communication Communication: HOH   Cognition Arousal/Alertness: Awake/alert Behavior During Therapy: WFL for tasks assessed/performed Overall Cognitive Status: Within Functional Limits for tasks assessed                                 General Comments: granddaughter helpful for home set up, recent functional and medical  history     General Comments       Exercises     Shoulder Instructions      Home Living Family/patient expects to be discharged to:: Private residence Living Arrangements: Spouse/significant other;Other (Comment) (has been with her granddaughter x 2 weeks) Available Help at Discharge: Family;Available 24 hours/day (husband cannot physically assist, granddaughter works from home) Type of Home: House       Home Layout: Two level Alternate Level Stairs-Number of Steps: flight with chair lift at granddaughter   Bathroom Shower/Tub: Tub/shower unit         Home Equipment: Agricultural consultant (2 wheels);Rollator (4 wheels);Cane - quad;Wheelchair - manual          Prior Functioning/Environment Prior Level of Function : Independent/Modified Independent             Mobility Comments: before March was using a RW as needed, driving, delivering mobile meals, playing bingo, using a rollator prior to this admission with supervision ADLs Comments: independent prior to March, has been needing help for bathing, dressing, toileting and all IADLs        OT Problem List: Decreased strength;Decreased activity tolerance;Impaired balance (sitting and/or  standing)      OT Treatment/Interventions: Self-care/ADL training;Energy conservation;DME and/or AE instruction;Therapeutic activities;Patient/family education;Balance training    OT Goals(Current goals can be found in the care plan section) Acute Rehab OT Goals OT Goal Formulation: With patient/family Time For Goal Achievement: 11/05/22 Potential to Achieve Goals: Good ADL Goals Pt Will Perform Grooming: with modified independence;standing (at least 2 activities) Pt Will Perform Lower Body Bathing: with modified independence;sit to/from stand Pt Will Perform Lower Body Dressing: with modified independence;sit to/from stand Pt Will Transfer to Toilet: with modified independence;ambulating;bedside commode Pt Will Perform Toileting - Clothing Manipulation and hygiene: with modified independence;sit to/from stand Additional ADL Goal #1: Pt will employ energy conservation strategies in ADLs and mobility. Additional ADL Goal #2: Pt will gather items necessary for ADLs with rollator mod I.  OT Frequency: Min 1X/week    Co-evaluation              AM-PAC OT "6 Clicks" Daily Activity     Outcome Measure Help from another person eating meals?: None Help from another person taking care of personal grooming?: A Little Help from another person toileting, which includes using toliet, bedpan, or urinal?: A Little Help from another person bathing (including washing, rinsing, drying)?: A Little Help  from another person to put on and taking off regular upper body clothing?: A Little Help from another person to put on and taking off regular lower body clothing?: A Little 6 Click Score: 19   End of Session Equipment Utilized During Treatment: Gait belt;Rolling walker (2 wheels) Nurse Communication: Other (comment) (aware IV complete)  Activity Tolerance: Patient limited by fatigue Patient left: in bed;with call bell/phone within reach;with bed alarm set;with family/visitor present  OT Visit  Diagnosis: Unsteadiness on feet (R26.81);Other abnormalities of gait and mobility (R26.89);Muscle weakness (generalized) (M62.81)                Time: 1610-9604 OT Time Calculation (min): 29 min Charges:  OT General Charges $OT Visit: 1 Visit OT Evaluation $OT Eval Moderate Complexity: 1 Mod  Berna Spare, OTR/L Acute Rehabilitation Services Office: [phone]   Evern Bio 10/22/2022, 2:11 PM

## 2022-10-22 NOTE — Progress Notes (Signed)
PROGRESS NOTE    SHAKIRRA REM  ZOX:[id number] DOB: 12-31-1940 DOA: 10/20/2022 PCP: Pcp, No    Brief Narrative:  82 year old with history of chronic diastolic dysfunction with known ejection fraction of 50%, recent admission at [facility] for fluid overload, essential hypertension, chronic respiratory failure on 2 L oxygen at home, CKD stage IV, history of PE and recently taken off Eliquis as she completed therapy, anemia of chronic disease who is visiting her granddaughter who noted patient being short of breath and fatigue so brought to the ER. In the emergency room on 3 L oxygen.  Hemoglobin 8.1.  Potassium 3.  CTA positive for small right upper lobe and right lower lobe segmental PE.  Admitted with heparin infusion.   Assessment & Plan:   Acute recurrent pulmonary embolism: Apparently Eliquis discontinued 2 months ago.  Patient does not have history of active bleeding or life-threatening bleeding.  She has chronic anemia.  She would likely benefit with ongoing Eliquis with close monitoring of hemoglobin.  Started on Eliquis and tolerating well. Will request outpatient follow-up.  Anemia of chronic disease, acute on chronic.  Reviewed baseline hemoglobin about 7-9.  No evidence of active bleeding.  Likely due to CKD. Hemoglobin 6.1-2 units of PRBC-8-7.5.  Dose of IV iron today.  She will need close monitoring of hemoglobin on Eliquis.  Chronic medical issues including Chronic heart failure with preserved ejection fraction: Euvolemic.  Resumed spironolactone, Marcelline Deist. Chronic hypoxemic respiratory failure: At about her baseline.  On 2 to 3 L of oxygen. CKD stage IV: At about her baseline. Hypokalemia: Replaced.  Adequate.  Resume spironolactone. Pulm hypertension: Continue tafamidis. Rheumatoid arthritis: On Plaquenil.  Continue.  Clinically stabilizing.  Work with PT OT.  If improved mobility, anticipate discharge later today or tomorrow.   DVT prophylaxis:  apixaban (ELIQUIS)  tablet 10 mg  apixaban (ELIQUIS) tablet 5 mg   Code Status: Full code Family Communication: Granddaughter Kim on the phone Disposition Plan: Status is: Observation The patient will require care spanning > 2 midnights and should be moved to inpatient because: Blood transfusion, monitoring for bleeding     Consultants:  None  Procedures:  None  Antimicrobials:  None   Subjective:  Patient seen and examined today.  She is denying any complaints.  She is very thankful about the care and she thinks she has been doing better.  Looking forward to go home.  She has not been out of bed yet.  Objective: Vitals:   10/21/22 1454 10/21/22 1650 10/21/22 1930 10/22/22 0318  BP: (!) 94/57 (!) 89/52 (!) 91/50 110/62  Pulse:   73 70  Resp:   15 19  Temp: 97.9 F (36.6 C) 97.9 F (36.6 C) 98.1 F (36.7 C) 98.3 F (36.8 C)  TempSrc: Oral Oral Oral Oral  SpO2:   100% 100%  Weight:    55.3 kg  Height:        Intake/Output Summary (Last 24 hours) at 10/22/2022 1101 Last data filed at 10/22/2022 0319 Gross per 24 hour  Intake 972.09 ml  Output 800 ml  Net 172.09 ml    Filed Weights   10/20/22 2135 10/21/22 0423 10/22/22 0318  Weight: 93.6 kg 54.8 kg 55.3 kg    Examination:  General exam: Looks fairly comfortable on 2 L oxygen.  Respiratory system: Patient does have decreased air entry on the right posterior base with some expiratory crackles.  Good air entry otherwise. Cardiovascular system: S1 & S2 heard, RRR.  Chronic nonpitting  edema of the legs. Gastrointestinal system: Abdomen is nondistended, soft and nontender. No organomegaly or masses felt. Normal bowel sounds heard. Central nervous system: Alert and oriented. No focal neurological deficits. Extremities: Symmetric 5 x 5 power. Skin: No rashes, lesions or ulcers Psychiatry: Judgement and insight appear normal. Mood & affect appropriate.     Data Reviewed: I have personally reviewed following labs and imaging  studies  CBC: Recent Labs  Lab 10/20/22 2135 10/21/22 0726 10/21/22 2008 10/22/22 0207  WBC 5.7 4.7  --  5.6  HGB 8.1* 6.1* 8.0* 7.5*  HCT 26.0* 19.5* 25.0* 23.5*  MCV 99.2 98.0  --  94.8  PLT 249 213  --  195    Basic Metabolic Panel: Recent Labs  Lab 10/20/22 2135 10/21/22 0030 10/21/22 0726  NA 134*  --  136  K 3.0*  --  3.7  CL 96*  --  101  CO2 25  --  26  GLUCOSE 141*  --  84  BUN 26*  --  23  CREATININE 1.47*  --  1.32*  CALCIUM 8.7*  --  8.2*  MG  --  1.9  --     GFR: Estimated Creatinine Clearance: 27.7 mL/min (A) (by C-G formula based on SCr of 1.32 mg/dL (H)). Liver Function Tests: No results for input(s): "AST", "ALT", "ALKPHOS", "BILITOT", "PROT", "ALBUMIN" in the last 168 hours. No results for input(s): "LIPASE", "AMYLASE" in the last 168 hours. No results for input(s): "AMMONIA" in the last 168 hours. Coagulation Profile: No results for input(s): "INR", "PROTIME" in the last 168 hours. Cardiac Enzymes: No results for input(s): "CKTOTAL", "CKMB", "CKMBINDEX", "TROPONINI" in the last 168 hours. BNP (last 3 results) No results for input(s): "PROBNP" in the last 8760 hours. HbA1C: No results for input(s): "HGBA1C" in the last 72 hours. CBG: No results for input(s): "GLUCAP" in the last 168 hours. Lipid Profile: No results for input(s): "CHOL", "HDL", "LDLCALC", "TRIG", "CHOLHDL", "LDLDIRECT" in the last 72 hours. Thyroid Function Tests: No results for input(s): "TSH", "T4TOTAL", "FREET4", "T3FREE", "THYROIDAB" in the last 72 hours. Anemia Panel: No results for input(s): "VITAMINB12", "FOLATE", "FERRITIN", "TIBC", "IRON", "RETICCTPCT" in the last 72 hours. Sepsis Labs: No results for input(s): "PROCALCITON", "LATICACIDVEN" in the last 168 hours.  No results found for this or any previous visit (from the past 240 hour(s)).       Radiology Studies: CT Angio Chest PE W and/or Wo Contrast  Result Date: 10/20/2022 CLINICAL DATA:  Shortness of  breath EXAM: CT ANGIOGRAPHY CHEST WITH CONTRAST TECHNIQUE: Multidetector CT imaging of the chest was performed using the standard protocol during bolus administration of intravenous contrast. Multiplanar CT image reconstructions and MIPs were obtained to evaluate the vascular anatomy. RADIATION DOSE REDUCTION: This exam was performed according to the departmental dose-optimization program which includes automated exposure control, adjustment of the mA and/or kV according to patient size and/or use of iterative reconstruction technique. CONTRAST:  75mL OMNIPAQUE IOHEXOL 350 MG/ML SOLN COMPARISON:  Chest x-ray 10/20/2022, thoracic spine CT 04/02/2014 FINDINGS: Cardiovascular: Satisfactory opacification of the pulmonary arteries to the segmental level. Positive for acute pulmonary embolus within right upper lobe segmental and subsegmental vessels as well as right lower lobe segmental and subsegmental pulmonary vessels. Small volume thrombus within the distal descending right pulmonary artery. Small volume emboli within left lower lobe distal segmental/subsegmental vessels. Tiny thrombus in subsegmental lingular vessel. borderline RV LV ratio of 1.0. Moderate aortic atherosclerosis. No aortic aneurysm. Coronary vascular calcification. Cardiomegaly. No pericardial effusion. Mediastinum/Nodes:  Midline trachea. No suspicious thyroid mass. No suspicious lymph nodes. Esophagus within normal limits. Lungs/Pleura: No consolidation or effusion. Heterogeneous bilateral ground-glass densities. Upper Abdomen: No acute finding. Musculoskeletal: Moderate severe compression deformities T6, and T12 with mild superior endplate deformity at T5 of uncertain age. Review of the MIP images confirms the above findings. IMPRESSION: 1. Positive for acute pulmonary embolus with small volume emboli within right upper lobe, right lower lobe, and left lower lobe segmental and subsegmental vessels. Borderline RV LV ratio of 1.0. 2. Cardiomegaly.  Heterogeneous bilateral ground-glass densities, question edema or atypical infection. 3. Aortic atherosclerosis. Critical Value/emergent results were called by telephone at the time of interpretation on 10/20/2022 at 11:48 pm to provider Dr. Pilar Plate, Who verbally acknowledged these results. Aortic Atherosclerosis (ICD10-I70.0). Electronically Signed   By: Jasmine Pang M.D.   On: 10/20/2022 23:48   DG Chest Portable 1 View  Result Date: 10/20/2022 CLINICAL DATA:  Shortness of breath. EXAM: PORTABLE CHEST 1 VIEW COMPARISON:  02/15/2018. FINDINGS: Heart is enlarged and the mediastinal contour is within normal limits. There is atherosclerotic calcification of the aorta. Diffuse interstitial prominence is noted bilaterally. No consolidation, effusion, or pneumothorax. No acute osseous abnormality. IMPRESSION: Diffuse interstitial prominence bilaterally which may in part be chronic versus edema or infiltrate. Electronically Signed   By: Thornell Sartorius M.D.   On: 10/20/2022 21:47        Scheduled Meds:  apixaban  10 mg Oral BID   Followed by   Melene Muller ON 10/28/2022] apixaban  5 mg Oral BID   carbamide peroxide  5 drop Both EARS BID   cyanocobalamin  1,000 mcg Oral Daily   dapagliflozin propanediol  10 mg Oral QHS   docusate sodium  100 mg Oral Daily   ferrous sulfate  325 mg Oral Q breakfast   folic acid  1 mg Oral Daily   hydroxychloroquine  200 mg Oral BID   rosuvastatin  20 mg Oral QHS   sodium chloride flush  3 mL Intravenous Q12H   spironolactone  25 mg Oral Daily   Tafamidis  61 mg Oral Daily   Continuous Infusions:  iron sucrose       LOS: 0 days    Time spent: 35 minutes.      Dorcas Carrow, MD [facility]

## 2022-10-22 NOTE — Evaluation (Signed)
Physical Therapy Evaluation Patient Details Name: Carolyn Mills MRN: [id number] DOB: 1940/10/01 Today's Date: 10/22/2022  History of Present Illness  Pt is an 82 year old woman from [place], [place] who has been staying with her granddaughter x 2 weeks due to continued decline in functional status/weakness and inability for her husband to care for her at home. On 7/4 pt had an abrupt onset of shortness of breath. CT+ multiple pulmonary emboli. PMH: RA, CAD, PE, HTN, HLD, CHF (EF 55%), chronic respiratory failure on 2L O2, CKD IV, LE edema.   Clinical Impression  Pt in bed upon arrival of PT, agreeable to evaluation at this time. Prior to this admission, the pt required assistance and use of RW for mobility, no recent falls but was limited in endurance and stability. The pt was independent with use of a cane prior to March of this year though, and family are hopeful the pt can return to her prior level of mobility. The pt was able to complete bed mobility with limited assistance, and short distance hallway ambulation with minG and use of RW. SpO2 stable on RA with activity, but pt limited by fatigue. Will benefit from short stint inpatient therapies to facilitate return to prior level of independence so that she can return home with her spouse who can provide supervision but not physical assistance.         Assistance Recommended at Discharge Frequent or constant Supervision/Assistance  If plan is discharge home, recommend the following:  Can travel by private vehicle  A little help with walking and/or transfers;A little help with bathing/dressing/bathroom;Assistance with cooking/housework;Help with stairs or ramp for entrance;Assist for transportation   Yes    Equipment Recommendations None recommended by PT  Recommendations for Other Services       Functional Status Assessment Patient has had a recent decline in their functional status and demonstrates the ability to make significant  improvements in function in a reasonable and predictable amount of time.     Precautions / Restrictions Precautions Precautions: Fall Restrictions Weight Bearing Restrictions: No      Mobility  Bed Mobility Overal bed mobility: Needs Assistance Bed Mobility: Supine to Sit, Sit to Supine     Supine to sit: Supervision, HOB elevated Sit to supine: Min guard, HOB elevated   General bed mobility comments: uses hands to help LEs back into bed    Transfers Overall transfer level: Needs assistance Equipment used: Rolling walker (2 wheels) Transfers: Sit to/from Stand Sit to Stand: Min guard           General transfer comment: minG to rise to standing at EOB, cue sfor hand placement on RW    Ambulation/Gait Ambulation/Gait assistance: Min guard Gait Distance (Feet): 45 Feet Assistive device: Rolling walker (2 wheels) Gait Pattern/deviations: Step-through pattern, Decreased stride length, Decreased dorsiflexion - right, Decreased dorsiflexion - left, Trunk flexed Gait velocity: decreased Gait velocity interpretation: <1.31 ft/sec, indicative of household ambulator   General Gait Details: pt with small steps and maintains trunk flexed and RW outside BOS despite cues. reports limited by fatigue     Balance Overall balance assessment: Needs assistance   Sitting balance-Leahy Scale: Good       Standing balance-Leahy Scale: Poor Standing balance comment: reliant on RW for ambulation, fair balance for static standing, tires quickly in static standing  Pertinent Vitals/Pain Pain Assessment Pain Assessment: Faces Pain Score: 0-No pain Faces Pain Scale: No hurt Pain Intervention(s): Monitored during session    Home Living Family/patient expects to be discharged to:: Private residence Living Arrangements: Spouse/significant other;Other (Comment) (has been with her granddaughter x 2 weeks) Available Help at Discharge:  Family;Available 24 hours/day (husband cannot physically assist, granddaughter works from home) Type of Home: House       Alternate Level Stairs-Number of Steps: flight with chair lift at granddaughter Home Layout: Two level Home Equipment: Agricultural consultant (2 wheels);Rollator (4 wheels);Cane - quad;Wheelchair - manual      Prior Function Prior Level of Function : Independent/Modified Independent             Mobility Comments: before March was using a 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 pt reports sensation intact, able to demo good strength and movement against gravity LLE Deficits / Details: edema in BLE present, but improved compared to initial swelling when volume overloaded in march. pt reports sensation intact, able to demo good strength and movement against gravity    Cervical / Trunk Assessment Cervical / Trunk Assessment: Kyphotic;Other exceptions (forward head)  Communication   Communication: HOH  Cognition Arousal/Alertness: Awake/alert Behavior During Therapy: WFL for tasks assessed/performed Overall Cognitive Status: Within Functional Limits for tasks assessed                                 General Comments: granddaughter helpful for home set up, recent functional and medical  history        General Comments General comments (skin integrity, edema, etc.): VSS on RA with mobility. BP  initially soft but improved with mobility    Exercises     Assessment/Plan    PT Assessment Patient needs continued PT services  PT Problem List Decreased activity tolerance;Decreased range of motion;Decreased balance;Decreased mobility       PT Treatment Interventions DME instruction;Gait training;Stair training;Functional mobility training;Therapeutic activities;Balance training;Therapeutic exercise;Patient/family education    PT Goals (Current goals can be found in the Care Plan section)  Acute Rehab PT Goals Patient Stated Goal: return to independence PT Goal Formulation: With patient Time For Goal Achievement: 11/05/22 Potential to Achieve Goals: Good    Frequency Min 3X/week        AM-PAC PT "6 Clicks" Mobility  Outcome Measure Help needed turning from your back to your side while in a flat bed without using bedrails?: None Help needed moving from lying on your back to sitting on the side of a flat bed without using bedrails?: A Little Help needed moving to and from a bed to a chair (including a wheelchair)?: A Little Help needed standing up from a chair using your arms (e.g., wheelchair or bedside chair)?: A Little Help needed to walk in hospital room?: A Little Help needed climbing 3-5 steps with a railing? : A Little 6 Click Score: 19    End  of Session Equipment Utilized During Treatment: Gait belt Activity Tolerance: Patient tolerated treatment well Patient left: in bed;with call bell/phone within reach;with bed alarm set;with family/visitor present Nurse Communication: Mobility status PT Visit Diagnosis: Other abnormalities of gait and mobility (R26.89);Muscle weakness (generalized) (M62.81)    Time: 1610-9604 PT Time Calculation (min) (ACUTE ONLY): 30 min   Charges:   PT Evaluation $PT Eval Low Complexity: 1 Low   PT General Charges $$ ACUTE PT VISIT: 1 Visit         Vickki Muff, PT, DPT   Acute Rehabilitation Department Office  [phone] Secure Chat Communication Preferred  Ronnie Derby 10/22/2022, 4:01 PM

## 2022-10-23 DIAGNOSIS — I272 Pulmonary hypertension, unspecified: Secondary | ICD-10-CM | POA: Diagnosis not present

## 2022-10-23 DIAGNOSIS — I5032 Chronic diastolic (congestive) heart failure: Secondary | ICD-10-CM | POA: Diagnosis not present

## 2022-10-23 DIAGNOSIS — D638 Anemia in other chronic diseases classified elsewhere: Secondary | ICD-10-CM | POA: Diagnosis not present

## 2022-10-23 LAB — CBC
HCT: 23.8 % — ABNORMAL LOW (ref 36.0–46.0)
Hemoglobin: 7.6 g/dL — ABNORMAL LOW (ref 12.0–15.0)
MCH: 30.5 pg (ref 26.0–34.0)
MCHC: 31.9 g/dL (ref 30.0–36.0)
MCV: 95.6 fL (ref 80.0–100.0)
Platelets: 165 10*3/uL (ref 150–400)
RBC: 2.49 MIL/uL — ABNORMAL LOW (ref 3.87–5.11)
RDW: 16.5 % — ABNORMAL HIGH (ref 11.5–15.5)
WBC: 4.3 10*3/uL (ref 4.0–10.5)
nRBC: 0.5 % — ABNORMAL HIGH (ref 0.0–0.2)

## 2022-10-23 NOTE — Progress Notes (Signed)
PROGRESS NOTE    Carolyn Mills  WGN:[id number] DOB: 07-09-1940 DOA: 10/20/2022 PCP: Pcp, No    Brief Narrative:  82 year old with history of chronic diastolic dysfunction with known ejection fraction of 50%, recent admission at [facility] for fluid overload, essential hypertension, chronic respiratory failure on 2 L oxygen at home, CKD stage IV, history of PE and recently taken off Eliquis as she completed therapy, anemia of chronic disease who is visiting her granddaughter who noted patient being short of breath and fatigue so brought to the ER. In the emergency room on 3 L oxygen.  Hemoglobin 8.1.  Potassium 3.  CTA positive for small right upper lobe and right lower lobe segmental PE.  Admitted with heparin infusion.   Assessment & Plan:   Acute recurrent pulmonary embolism: Apparently Eliquis discontinued 2 months ago.  Patient does not have history of active bleeding or life-threatening bleeding.  She has chronic anemia.  She would likely benefit with ongoing Eliquis with close monitoring of hemoglobin.  Started on Eliquis and tolerating well. Will request outpatient follow-up.  Anemia of chronic disease, acute on chronic.  Reviewed baseline hemoglobin about 7-9.  No evidence of active bleeding.  Likely due to CKD. Hemoglobin 6.1-2 units of PRBC-8-7.5.  Dose of IV iron x 3 while in the hospital. She will need close monitoring of hemoglobin on Eliquis.  Chronic medical issues including Chronic heart failure with preserved ejection fraction: Euvolemic.  Resumed spironolactone, Marcelline Deist. Chronic hypoxemic respiratory failure: At about her baseline.  On 2 to 3 L of oxygen. CKD stage IV: At about her baseline. Hypokalemia: Replaced.  Adequate.  Resume spironolactone. Pulm hypertension: Continue tafamidis. Rheumatoid arthritis: On Plaquenil.  Continue.  Clinically stabilizing.  Work with PT OT.  Refer to SNF  DVT prophylaxis:  apixaban (ELIQUIS) tablet 10 mg  apixaban (ELIQUIS)  tablet 5 mg   Code Status: Full code Family Communication: Granddaughter Kim on the phone Disposition Plan: Status is: Inpatient.  Remains inpatient because of unsafe discharge.  Needs skilled nursing facility placement.     Consultants:  None  Procedures:  None  Antimicrobials:  None   Subjective:  Patient seen and examined.  Denies any complaints.  Mostly on room air.  Granddaughter on the phone.  Objective: Vitals:   10/22/22 2009 10/23/22 0503 10/23/22 0703 10/23/22 0922  BP: (!) 112/59 (!) 103/54 (!) 92/55 95/60  Pulse: 80 72 64 83  Resp: 20 18 11 20   Temp: 97.6 F (36.4 C) 98.5 F (36.9 C)    TempSrc: Oral Oral    SpO2: 99% 98% 100% 100%  Weight:  55.1 kg    Height:        Intake/Output Summary (Last 24 hours) at 10/23/2022 1128 Last data filed at 10/23/2022 0900 Gross per 24 hour  Intake 227 ml  Output 400 ml  Net -173 ml    Filed Weights   10/21/22 0423 10/22/22 0318 10/23/22 0503  Weight: 54.8 kg 55.3 kg 55.1 kg    Examination:  General exam: Looks fairly comfortable on room air today. Respiratory system: Patient does have decreased air entry on the right posterior base with some expiratory crackles.  Good air entry otherwise.  Talking in complete sentences. Cardiovascular system: S1 & S2 heard, RRR.  Chronic nonpitting edema of the legs. Gastrointestinal system: Abdomen is nondistended, soft and nontender. No organomegaly or masses felt. Normal bowel sounds heard. Central nervous system: Alert and oriented. No focal neurological deficits. .     Data  Reviewed: I have personally reviewed following labs and imaging studies  CBC: Recent Labs  Lab 10/20/22 2135 10/21/22 0726 10/21/22 2008 10/22/22 0207 10/23/22 0217  WBC 5.7 4.7  --  5.6 4.3  HGB 8.1* 6.1* 8.0* 7.5* 7.6*  HCT 26.0* 19.5* 25.0* 23.5* 23.8*  MCV 99.2 98.0  --  94.8 95.6  PLT 249 213  --  195 165    Basic Metabolic Panel: Recent Labs  Lab 10/20/22 2135 10/21/22 0030  10/21/22 0726  NA 134*  --  136  K 3.0*  --  3.7  CL 96*  --  101  CO2 25  --  26  GLUCOSE 141*  --  84  BUN 26*  --  23  CREATININE 1.47*  --  1.32*  CALCIUM 8.7*  --  8.2*  MG  --  1.9  --     GFR: Estimated Creatinine Clearance: 27.7 mL/min (A) (by C-G formula based on SCr of 1.32 mg/dL (H)). Liver Function Tests: No results for input(s): "AST", "ALT", "ALKPHOS", "BILITOT", "PROT", "ALBUMIN" in the last 168 hours. No results for input(s): "LIPASE", "AMYLASE" in the last 168 hours. No results for input(s): "AMMONIA" in the last 168 hours. Coagulation Profile: No results for input(s): "INR", "PROTIME" in the last 168 hours. Cardiac Enzymes: No results for input(s): "CKTOTAL", "CKMB", "CKMBINDEX", "TROPONINI" in the last 168 hours. BNP (last 3 results) No results for input(s): "PROBNP" in the last 8760 hours. HbA1C: No results for input(s): "HGBA1C" in the last 72 hours. CBG: No results for input(s): "GLUCAP" in the last 168 hours. Lipid Profile: No results for input(s): "CHOL", "HDL", "LDLCALC", "TRIG", "CHOLHDL", "LDLDIRECT" in the last 72 hours. Thyroid Function Tests: No results for input(s): "TSH", "T4TOTAL", "FREET4", "T3FREE", "THYROIDAB" in the last 72 hours. Anemia Panel: No results for input(s): "VITAMINB12", "FOLATE", "FERRITIN", "TIBC", "IRON", "RETICCTPCT" in the last 72 hours. Sepsis Labs: No results for input(s): "PROCALCITON", "LATICACIDVEN" in the last 168 hours.  No results found for this or any previous visit (from the past 240 hour(s)).       Radiology Studies: No results found.      Scheduled Meds:  apixaban  10 mg Oral BID   Followed by   Melene Muller ON 10/28/2022] apixaban  5 mg Oral BID   carbamide peroxide  5 drop Both EARS BID   cyanocobalamin  1,000 mcg Oral Daily   dapagliflozin propanediol  10 mg Oral QHS   docusate sodium  100 mg Oral Daily   ferrous sulfate  325 mg Oral Q breakfast   folic acid  1 mg Oral Daily   hydroxychloroquine   200 mg Oral BID   rosuvastatin  20 mg Oral QHS   sodium chloride flush  3 mL Intravenous Q12H   spironolactone  25 mg Oral Daily   Tafamidis  61 mg Oral Daily   Continuous Infusions:  iron sucrose Stopped (10/22/22 1230)     LOS: 1 day    Time spent: 35 minutes.      Dorcas Carrow, MD [facility]

## 2022-10-23 NOTE — TOC Progression Note (Addendum)
Transition of Care [facility]) - Progression Note    Patient Details  Name: Carolyn Mills MRN: [id number] Date of Birth: January 06, 1941  Transition of Care [facility]) CM/SW Contact  Delilah Shan, LCSWA Phone Number: 10/23/2022, 9:33 AM  Clinical Narrative:     Update- 10:17am- CSW received call from Carolyn Mills patients Granddaughter who requested CSW to send referral to the [place] at [facility]. CSW faxed referral to [facility] in Admissions. CSW will follow up with facility to see if they can offer SNF bed for patient.Carolyn Mills also gave CSW [facility] skilled [facility] facility in [place]. CSW called facility and receptionist request for CSW to call back tomorrow for fax number for admissions. CSW will follow up tomorrow.  CSW received consult for possible SNF placement at time of discharge. CSW spoke with patient regarding PT recommendation of SNF placement at time of discharge.Patient requested for CSW to call her Granddaughter Carolyn Mills to discuss her dc plan. Patient provided CSW with her Granddaughter Carolyn Mills's telephone number [phone]. CSW called patients Granddaughter Carolyn Mills and discussed PT recommendations for SNF for patient at discharge.  Patients Granddaughter expressed understanding of PT recommendation and is agreeable to SNF placement for patient at time of discharge. Patients Granddaughter request for CSW to fax out initial referral near the [facility] and [place] area for possible SNF placement for patient. Patients Granddaughter informed CSW she will call CSW back with SNF's in [place] she would like CSW to send referral too. CSW discussed insurance authorization process.  No further questions reported at this time. CSW to continue to follow and assist with discharge planning needs.    Expected Discharge Plan: Skilled Nursing Facility Barriers to Discharge: Continued Medical Work up  Expected Discharge Plan and Services In-house Referral: Clinical Social Work Discharge Planning Services: CM Consult    Living arrangements for the past 2 months: Single Family Home                                       Social Determinants of Health (SDOH) Interventions SDOH Screenings   Food Insecurity: No Food Insecurity (10/21/2022)  Housing: Low Risk  (10/21/2022)  Transportation Needs: No Transportation Needs (10/21/2022)  Utilities: Not At Risk (10/21/2022)  Tobacco Use: Low Risk  (10/21/2022)    Readmission Risk Interventions     No data to display

## 2022-10-23 NOTE — NC FL2 (Signed)
[place] MEDICAID FL2 LEVEL OF CARE FORM     IDENTIFICATION  Patient Name: Carolyn Mills Birthdate: 1940-08-25 Sex: female Admission Date (Current Location): 10/20/2022  [facility] and [place] Number:  Producer, television/film/video and Address:  [facility], [address], [place], [place] [address]      Provider Number: [id number]  Attending Physician Name and Address:  Dorcas Carrow, MD  Relative Name and Phone Number:  Micah Flesher [phone] [facility] [phone]    Current Level of Care: Hospital Recommended Level of Care: Skilled Nursing Facility Prior Approval Number:    Date Approved/Denied:   PASRR Number: [id number]  Discharge Plan: SNF    Current Diagnoses: Patient Active Problem List   Diagnosis Date Noted   Pulmonary embolism (HCC) 10/22/2022   Pressure injury of skin 10/22/2022   Acute pulmonary embolism (HCC) 10/21/2022   Chronic heart failure with preserved ejection fraction (HFpEF) (HCC) 10/21/2022   Chronic respiratory failure with hypoxia (HCC) 10/21/2022   CKD (chronic kidney disease), stage IV (HCC) 10/21/2022   Anemia of chronic disease 10/21/2022   Hypokalemia 10/21/2022   Pulmonary hypertension (HCC) 10/21/2022   Essential hypertension 10/21/2022   Hyperlipidemia 10/21/2022   Rheumatoid arthritis (HCC) 10/21/2022   CAP (community acquired pneumonia) 02/18/2018   Sepsis (HCC) 02/15/2018   CHF exacerbation (HCC) 02/15/2018    Orientation RESPIRATION BLADDER Height & Weight     Self, Time, Situation, Place  Normal Continent Weight: 121 lb 8 oz (55.1 kg) Height:  5\' 3"  (160 cm)  BEHAVIORAL SYMPTOMS/MOOD NEUROLOGICAL BOWEL NUTRITION STATUS      Continent Diet (Please see discharge summary)  AMBULATORY STATUS COMMUNICATION OF NEEDS Skin   Limited Assist Verbally Other (Comment) (Abrasion,foot,sacrum,vertebral column,Right,Upper,Wound/Incision LDAs,PI sacrum,upper stage 2,foam-lift dressing,PRN)                        Personal Care Assistance Level of Assistance  Bathing, Feeding, Dressing Bathing Assistance: Limited assistance Feeding assistance: Independent Dressing Assistance: Limited assistance     Functional Limitations Info  Sight, Hearing, Speech Sight Info: Adequate Hearing Info: Adequate Speech Info: Adequate    SPECIAL CARE FACTORS FREQUENCY  PT (By licensed PT), OT (By licensed OT)     PT Frequency: 5x min weekly OT Frequency: 5x min weekly            Contractures Contractures Info: Not present    Additional Factors Info  Code Status, Allergies Code Status Info: FULL Allergies Info: Propoxyphene,Clindamycin,Doxycycline,Sulfamethoxazole-trimethoprim           Current Medications (10/23/2022):  This is the current hospital active medication list Current Facility-Administered Medications  Medication Dose Route Frequency Provider Last Rate Last Admin   acetaminophen (TYLENOL) tablet 650 mg  650 mg Oral Q6H PRN Charlsie Quest, MD       Or   acetaminophen (TYLENOL) suppository 650 mg  650 mg Rectal Q6H PRN Charlsie Quest, MD       apixaban Everlene Balls) tablet 10 mg  10 mg Oral BID Leander Rams, RPH   10 mg at 10/23/22 2725   Followed by   Melene Muller ON 10/28/2022] apixaban (ELIQUIS) tablet 5 mg  5 mg Oral BID Alphia Moh D, RPH       carbamide peroxide (DEBROX) 6.5 % OTIC (EAR) solution 5 drop  5 drop Both EARS BID Darreld Mclean R, MD   5 drop at 10/23/22 0912   cyanocobalamin (VITAMIN B12) tablet 1,000 mcg  1,000 mcg  Oral Daily Darreld Mclean R, MD   1,000 mcg at 10/23/22 0911   dapagliflozin propanediol (FARXIGA) tablet 10 mg  10 mg Oral QHS Darreld Mclean R, MD   10 mg at 10/22/22 2017   docusate sodium (COLACE) capsule 100 mg  100 mg Oral Daily Darreld Mclean R, MD   100 mg at 10/23/22 1610   ferrous sulfate tablet 325 mg  325 mg Oral Q breakfast Darreld Mclean R, MD   325 mg at 10/23/22 0911   folic acid (FOLVITE) tablet 1 mg  1 mg Oral Daily Darreld Mclean R, MD   1 mg at  10/23/22 9604   hydroxychloroquine (PLAQUENIL) tablet 200 mg  200 mg Oral BID Darreld Mclean R, MD   200 mg at 10/23/22 0911   iron sucrose (VENOFER) 200 mg in sodium chloride 0.9 % 100 mL IVPB  200 mg Intravenous Q24H Dorcas Carrow, MD   Stopped at 10/22/22 1230   ondansetron (ZOFRAN) tablet 4 mg  4 mg Oral Q6H PRN Charlsie Quest, MD       Or   ondansetron (ZOFRAN) injection 4 mg  4 mg Intravenous Q6H PRN Darreld Mclean R, MD   4 mg at 10/21/22 1001   rosuvastatin (CRESTOR) tablet 20 mg  20 mg Oral QHS Darreld Mclean R, MD   20 mg at 10/22/22 2017   senna-docusate (Senokot-S) tablet 1 tablet  1 tablet Oral QHS PRN Darreld Mclean R, MD       sodium chloride flush (NS) 0.9 % injection 3 mL  3 mL Intravenous Q12H Darreld Mclean R, MD   3 mL at 10/23/22 0911   spironolactone (ALDACTONE) tablet 25 mg  25 mg Oral Daily Dorcas Carrow, MD   25 mg at 10/23/22 0911   Tafamidis CAPS 61 mg  61 mg Oral Daily Charlsie Quest, MD         Discharge Medications: Please see discharge summary for a list of discharge medications.  Relevant Imaging Results:  Relevant Lab Results:   Additional Information [id number]  Delilah Shan, LCSWA

## 2022-10-24 DIAGNOSIS — I272 Pulmonary hypertension, unspecified: Secondary | ICD-10-CM | POA: Diagnosis not present

## 2022-10-24 DIAGNOSIS — I5032 Chronic diastolic (congestive) heart failure: Secondary | ICD-10-CM | POA: Diagnosis not present

## 2022-10-24 DIAGNOSIS — D638 Anemia in other chronic diseases classified elsewhere: Secondary | ICD-10-CM | POA: Diagnosis not present

## 2022-10-24 LAB — CBC
HCT: 25 % — ABNORMAL LOW (ref 36.0–46.0)
Hemoglobin: 8 g/dL — ABNORMAL LOW (ref 12.0–15.0)
MCH: 30.4 pg (ref 26.0–34.0)
MCHC: 32 g/dL (ref 30.0–36.0)
MCV: 95.1 fL (ref 80.0–100.0)
Platelets: 163 10*3/uL (ref 150–400)
RBC: 2.63 MIL/uL — ABNORMAL LOW (ref 3.87–5.11)
RDW: 16.3 % — ABNORMAL HIGH (ref 11.5–15.5)
WBC: 3.7 10*3/uL — ABNORMAL LOW (ref 4.0–10.5)
nRBC: 0 % (ref 0.0–0.2)

## 2022-10-24 NOTE — Progress Notes (Signed)
PROGRESS NOTE    Carolyn Mills  WUJ:[id number] DOB: December 20, 1940 DOA: 10/20/2022 PCP: Pcp, No    Brief Narrative:  82 year old with history of chronic diastolic dysfunction with known ejection fraction of 50%, recent admission at [facility] for fluid overload, essential hypertension, chronic respiratory failure on 2 L oxygen at home, CKD stage IV, history of PE and recently taken off Eliquis as she completed therapy, anemia of chronic disease who is visiting her granddaughter who noted patient being short of breath and fatigue so brought to the ER. In the emergency room on 3 L oxygen.  Hemoglobin 8.1.  Potassium 3.  CTA positive for small right upper lobe and right lower lobe segmental PE.  Admitted with heparin infusion.   Assessment & Plan:   Acute recurrent pulmonary embolism: Apparently Eliquis discontinued 2 months ago.  Patient does not have history of active bleeding or life-threatening bleeding.  She has chronic anemia.  She would likely benefit with ongoing Eliquis with close monitoring of hemoglobin.  Started on Eliquis and tolerating well. Will request outpatient follow-up.  Anemia of chronic disease, acute on chronic.  Reviewed baseline hemoglobin about 7-9.  No evidence of active bleeding.  Likely due to CKD. Hemoglobin 6.1-2 units of PRBC-8-7.5-8.  Dose of IV iron x 3 while in the hospital. She will need close monitoring of hemoglobin on Eliquis.  Chronic medical issues including Chronic heart failure with preserved ejection fraction: Euvolemic.  Resumed spironolactone, Marcelline Deist. Chronic hypoxemic respiratory failure: At about her baseline.  On 2 to 3 L of oxygen. CKD stage IV: At about her baseline. Hypokalemia: Replaced.  Adequate.  Resume spironolactone. Pulm hypertension: Continue tafamidis. Rheumatoid arthritis: On Plaquenil.  Continue.  Clinically stable.  Transfer to SNF when bed available.  DVT prophylaxis:  apixaban (ELIQUIS) tablet 10 mg  apixaban (ELIQUIS)  tablet 5 mg   Code Status: Full code Family Communication: None today. Disposition Plan: Status is: Inpatient.  Remains inpatient because of unsafe discharge.  Needs skilled nursing facility placement.  Medically stable.     Consultants:  None  Procedures:  None  Antimicrobials:  None   Subjective:  Patient seen and examined.  She has some left earache and impacted cerumen that is bothering her too much.  Denies any other complaints. Objective: Vitals:   10/23/22 1120 10/23/22 2041 10/24/22 0414 10/24/22 0728  BP: 97/62 112/64 (!) 103/57 (!) 111/57  Pulse: 64 81 80 80  Resp: 20 18 18 18   Temp: 98.1 F (36.7 C) 97.8 F (36.6 C) 97.9 F (36.6 C) 97.8 F (36.6 C)  TempSrc: Oral Oral Oral Oral  SpO2: 100% 100% 100% 99%  Weight:   54.5 kg   Height:        Intake/Output Summary (Last 24 hours) at 10/24/2022 1154 Last data filed at 10/24/2022 0729 Gross per 24 hour  Intake 109.36 ml  Output 100 ml  Net 9.36 ml    Filed Weights   10/22/22 0318 10/23/22 0503 10/24/22 0414  Weight: 55.3 kg 55.1 kg 54.5 kg    Examination:  General exam: Looks fairly comfortable.  She is mostly on room air. Respiratory system: On room air.  Fair air entry bilateral. Cardiovascular system: S1 & S2 heard, RRR.  Chronic nonpitting edema of the legs. Gastrointestinal system: Abdomen is nondistended, soft and nontender. No organomegaly or masses felt. Normal bowel sounds heard. Central nervous system: Alert and oriented. No focal neurological deficits. .  Left ear: No visualized abnormalities.    Data Reviewed:  I have personally reviewed following labs and imaging studies  CBC: Recent Labs  Lab 10/20/22 2135 10/21/22 0726 10/21/22 2008 10/22/22 0207 10/23/22 0217 10/24/22 0244  WBC 5.7 4.7  --  5.6 4.3 3.7*  HGB 8.1* 6.1* 8.0* 7.5* 7.6* 8.0*  HCT 26.0* 19.5* 25.0* 23.5* 23.8* 25.0*  MCV 99.2 98.0  --  94.8 95.6 95.1  PLT 249 213  --  195 165 163    Basic Metabolic  Panel: Recent Labs  Lab 10/20/22 2135 10/21/22 0030 10/21/22 0726  NA 134*  --  136  K 3.0*  --  3.7  CL 96*  --  101  CO2 25  --  26  GLUCOSE 141*  --  84  BUN 26*  --  23  CREATININE 1.47*  --  1.32*  CALCIUM 8.7*  --  8.2*  MG  --  1.9  --     GFR: Estimated Creatinine Clearance: 27.7 mL/min (A) (by C-G formula based on SCr of 1.32 mg/dL (H)). Liver Function Tests: No results for input(s): "AST", "ALT", "ALKPHOS", "BILITOT", "PROT", "ALBUMIN" in the last 168 hours. No results for input(s): "LIPASE", "AMYLASE" in the last 168 hours. No results for input(s): "AMMONIA" in the last 168 hours. Coagulation Profile: No results for input(s): "INR", "PROTIME" in the last 168 hours. Cardiac Enzymes: No results for input(s): "CKTOTAL", "CKMB", "CKMBINDEX", "TROPONINI" in the last 168 hours. BNP (last 3 results) No results for input(s): "PROBNP" in the last 8760 hours. HbA1C: No results for input(s): "HGBA1C" in the last 72 hours. CBG: No results for input(s): "GLUCAP" in the last 168 hours. Lipid Profile: No results for input(s): "CHOL", "HDL", "LDLCALC", "TRIG", "CHOLHDL", "LDLDIRECT" in the last 72 hours. Thyroid Function Tests: No results for input(s): "TSH", "T4TOTAL", "FREET4", "T3FREE", "THYROIDAB" in the last 72 hours. Anemia Panel: No results for input(s): "VITAMINB12", "FOLATE", "FERRITIN", "TIBC", "IRON", "RETICCTPCT" in the last 72 hours. Sepsis Labs: No results for input(s): "PROCALCITON", "LATICACIDVEN" in the last 168 hours.  No results found for this or any previous visit (from the past 240 hour(s)).       Radiology Studies: No results found.      Scheduled Meds:  apixaban  10 mg Oral BID   Followed by   Melene Muller ON 10/28/2022] apixaban  5 mg Oral BID   carbamide peroxide  5 drop Both EARS BID   cyanocobalamin  1,000 mcg Oral Daily   dapagliflozin propanediol  10 mg Oral QHS   docusate sodium  100 mg Oral Daily   ferrous sulfate  325 mg Oral Q  breakfast   folic acid  1 mg Oral Daily   hydroxychloroquine  200 mg Oral BID   rosuvastatin  20 mg Oral QHS   sodium chloride flush  3 mL Intravenous Q12H   spironolactone  25 mg Oral Daily   Continuous Infusions:    LOS: 2 days    Time spent: 35 minutes.      Dorcas Carrow, MD [facility]

## 2022-10-24 NOTE — Progress Notes (Signed)
Physical Therapy Treatment Patient Details Name: Carolyn Mills MRN: [id number] DOB: 1940/08/18 Today's Date: 10/24/2022   History of Present Illness Pt is an 82 year old woman from [place], [place] who has been staying with her granddaughter x 2 weeks due to continued decline in functional status/weakness and inability for her husband to care for her at home. On 7/4 pt had an abrupt onset of shortness of breath. CT+ multiple pulmonary emboli. PMH: RA, CAD, PE, HTN, HLD, CHF (EF 55%), chronic respiratory failure on 2L O2, CKD IV, LE edema.    PT Comments  Pt received sitting in the recliner and agreeable to session. Pt able to tolerate 2 short gait trials this session with a seated rest break between them due to fatigue and DOE. Pt demonstrates trunk flexion throughout gait due to back pain per pt report, however she is able to slightly improve during second gait trial. Pt is motivated to build endurance and improve mobility. SpO2 remaining WFL throughout session on RA. Pt demonstrating BLE therex she performs independently sitting in the recliner. Pt continues to benefit from PT services to progress toward functional mobility goals.     Assistance Recommended at Discharge Frequent or constant Supervision/Assistance  If plan is discharge home, recommend the following:  Can travel by private vehicle    A little help with walking and/or transfers;A little help with bathing/dressing/bathroom;Assistance with cooking/housework;Help with stairs or ramp for entrance;Assist for transportation   Yes  Equipment Recommendations  None recommended by PT    Recommendations for Other Services       Precautions / Restrictions Precautions Precautions: Fall Restrictions Weight Bearing Restrictions: No     Mobility  Bed Mobility               General bed mobility comments: Pt beginning and ending session in recliner    Transfers Overall transfer level: Needs assistance Equipment used:  Rolling walker (2 wheels) Transfers: Sit to/from Stand Sit to Stand: Min guard           General transfer comment: STS from recliner with min guard for safety    Ambulation/Gait Ambulation/Gait assistance: Min guard Gait Distance (Feet): 45 Feet (x2) Assistive device: Rolling walker (2 wheels) Gait Pattern/deviations: Step-through pattern, Decreased stride length, Trunk flexed Gait velocity: decreased     General Gait Details: Small short steps with flexed trunk due to back pain. Pt is able to improve upright posture during second trial with cues for closer RW proximity.    Balance Overall balance assessment: Needs assistance Sitting-balance support: No upper extremity supported, Feet unsupported Sitting balance-Leahy Scale: Good Sitting balance - Comments: sitting in recliner   Standing balance support: Bilateral upper extremity supported, During functional activity, Reliant on assistive device for balance Standing balance-Leahy Scale: Poor Standing balance comment: with RW support                            Cognition Arousal/Alertness: Awake/alert Behavior During Therapy: WFL for tasks assessed/performed Overall Cognitive Status: Within Functional Limits for tasks assessed                                          Exercises      General Comments General comments (skin integrity, edema, etc.): VSS on RA      Pertinent Vitals/Pain Pain Assessment Pain Assessment: Faces Faces  Pain Scale: Hurts little more Pain Location: back     PT Goals (current goals can now be found in the care plan section) Acute Rehab PT Goals Patient Stated Goal: return to independence PT Goal Formulation: With patient Time For Goal Achievement: 11/05/22 Potential to Achieve Goals: Good Progress towards PT goals: Progressing toward goals    Frequency    Min 3X/week      PT Plan Current plan remains appropriate       AM-PAC PT "6 Clicks" Mobility    Outcome Measure  Help needed turning from your back to your side while in a flat bed without using bedrails?: None Help needed moving from lying on your back to sitting on the side of a flat bed without using bedrails?: A Little Help needed moving to and from a bed to a chair (including a wheelchair)?: A Little Help needed standing up from a chair using your arms (e.g., wheelchair or bedside chair)?: A Little Help needed to walk in hospital room?: A Little Help needed climbing 3-5 steps with a railing? : A Little 6 Click Score: 19    End of Session Equipment Utilized During Treatment: Gait belt Activity Tolerance: Patient tolerated treatment well Patient left: in chair;with call bell/phone within reach Nurse Communication: Mobility status PT Visit Diagnosis: Other abnormalities of gait and mobility (R26.89);Muscle weakness (generalized) (M62.81)     Time: 1610-9604 PT Time Calculation (min) (ACUTE ONLY): 29 min  Charges:    $Gait Training: 23-37 mins PT General Charges $$ ACUTE PT VISIT: 1 Visit                     Johny Shock, PTA Acute Rehabilitation Services Secure Chat Preferred  [phone]    Johny Shock 10/24/2022, 3:44 PM

## 2022-10-24 NOTE — Plan of Care (Signed)

## 2022-10-24 NOTE — TOC Progression Note (Addendum)
Transition of Care [facility]) - Progression Note    Patient Details  Name: Carolyn Mills MRN: [id number] Date of Birth: Jul 10, 1940  [facility]) CM/SW Contact  Delilah Shan, LCSWA Phone Number: 10/24/2022, 8:50 AM  Clinical Narrative:     Update-4:12pm- CSW spoke with Selena Batten patients Granddaughter and let her know that the [facility] can offer. Selena Batten is going to give CSW call back on if she wants to accept SNF bed offer. CSW awaiting call back from Lupita Leash with [facility] to see if they can offer SNF bed for patient.  Update- 1:39pm- CSW received call from [place] with the [facility] who did confirm that can offer SNF bed for patient. CSW LVM for patients Granddaughter. CSW awaiting call back.  CSW called [facility] and LVM for admissions. CSW awaiting call back to receive fax number to fax over referral for patient. CSW received call from patients Granddaughter who request for CSW to fax over over referral to [facility] in [place]. CSW called and received fax number from facility and faxed over referral to facility for review. CSW spoke with Barbara Cower with the [facility] who informed CSW he hasn't been able to review referral yet but plans on reviewing and once determined if facility can offer will give CSW call back with determination.CSW will continue to follow and assist with patients dc planning needs.  Expected Discharge Plan: Skilled Nursing Facility Barriers to Discharge: Continued Medical Work up  Expected Discharge Plan and Services In-house Referral: Clinical Social Work Discharge Planning Services: CM Consult   Living arrangements for the past 2 months: Single Family Home                                       Social Determinants of Health (SDOH) Interventions SDOH Screenings   Food Insecurity: No Food Insecurity (10/21/2022)  Housing: Low Risk  (10/21/2022)  Transportation Needs: No Transportation  Needs (10/21/2022)  Utilities: Not At Risk (10/21/2022)  Tobacco Use: Low Risk  (10/21/2022)    Readmission Risk Interventions     No data to display

## 2022-10-25 DIAGNOSIS — I5032 Chronic diastolic (congestive) heart failure: Secondary | ICD-10-CM | POA: Diagnosis not present

## 2022-10-25 DIAGNOSIS — I2692 Saddle embolus of pulmonary artery without acute cor pulmonale: Secondary | ICD-10-CM

## 2022-10-25 DIAGNOSIS — J9611 Chronic respiratory failure with hypoxia: Secondary | ICD-10-CM | POA: Diagnosis not present

## 2022-10-25 DIAGNOSIS — D638 Anemia in other chronic diseases classified elsewhere: Secondary | ICD-10-CM | POA: Diagnosis not present

## 2022-10-25 LAB — CBC
HCT: 25.6 % — ABNORMAL LOW (ref 36.0–46.0)
Hemoglobin: 8.1 g/dL — ABNORMAL LOW (ref 12.0–15.0)
MCH: 30.7 pg (ref 26.0–34.0)
MCHC: 31.6 g/dL (ref 30.0–36.0)
MCV: 97 fL (ref 80.0–100.0)
Platelets: 161 10*3/uL (ref 150–400)
RBC: 2.64 MIL/uL — ABNORMAL LOW (ref 3.87–5.11)
RDW: 16.3 % — ABNORMAL HIGH (ref 11.5–15.5)
WBC: 6.3 10*3/uL (ref 4.0–10.5)
nRBC: 0.3 % — ABNORMAL HIGH (ref 0.0–0.2)

## 2022-10-25 MED ORDER — CARBAMIDE PEROXIDE 6.5 % OT SOLN: 5.0000 [drp] | Freq: Two times a day (BID) | OTIC | 0 refills | Status: AC

## 2022-10-25 NOTE — Plan of Care (Signed)

## 2022-10-25 NOTE — Progress Notes (Signed)
Mobility Specialist Progress Note:   10/25/22 0945  Mobility  Activity Transferred from bed to chair  Level of Assistance Contact guard assist, steadying assist  Assistive Device Front wheel walker  Distance Ambulated (ft) 3 ft  Activity Response Tolerated well  Mobility Referral Yes  $Mobility charge 1 Mobility  Mobility Specialist Start Time (ACUTE ONLY) 0945  Mobility Specialist Stop Time (ACUTE ONLY) 1000  Mobility Specialist Time Calculation (min) (ACUTE ONLY) 15 min   Pt agreeable to transfer to chair to eat breakfast. MinG required for safety to stand and pivot with RW. Will return for ambulation after breakfast. Left in chair with all needs met.   Addison Lank Mobility Specialist Please contact via SecureChat or  Rehab office at [phone]

## 2022-10-25 NOTE — Progress Notes (Signed)
Mobility Specialist Progress Note:   10/25/22 1050  Mobility  Activity Ambulated with assistance in hallway  Level of Assistance Contact guard assist, steadying assist  Assistive Device Front wheel walker  Distance Ambulated (ft) 130 ft  Activity Response Tolerated well  Mobility Referral Yes  $Mobility charge 1 Mobility  Mobility Specialist Start Time (ACUTE ONLY) 1050  Mobility Specialist Stop Time (ACUTE ONLY) 1105  Mobility Specialist Time Calculation (min) (ACUTE ONLY) 15 min   Pt eager for ambulation after breakfast. Only minG assist for ambulation. No c/o throughout. Back in chair with all needs met.   Addison Lank Mobility Specialist Please contact via SecureChat or  Rehab office at [phone]

## 2022-10-25 NOTE — TOC Progression Note (Addendum)
Transition of Care [facility]) - Progression Note    Patient Details  Name: CHRISSY THORNOCK MRN: [id number] Date of Birth: 1940/07/06  Transition of Care [facility]) CM/SW Contact  Delilah Shan, LCSWA Phone Number: 10/25/2022, 10:37 AM  Clinical Narrative:      Update- 12:15pm- CSW spoke with Barbara Cower in Admissions with the [facility] who confirmed they can accept patient for dc today if medically ready. CSW informed MD.   CSW received call from patients Granddaughter who informed CSW that patient has [facility]. CSW called [facility] to try and speak with Barbara Cower in Admissions. Secretary at [employer] informed CSW that [place] with Admissions will be in at 12:00pm. CSW will call Barbara Cower back at 12:00pm to confirm SNF bed for patient. CSW will continue to follow and assist with patients dc planning needs.  Expected Discharge Plan: Skilled Nursing Facility Barriers to Discharge: Continued Medical Work up  Expected Discharge Plan and Services In-house Referral: Clinical Social Work Discharge Planning Services: CM Consult   Living arrangements for the past 2 months: Single Family Home Expected Discharge Date: 10/25/22                                     Social Determinants of Health (SDOH) Interventions SDOH Screenings   Food Insecurity: No Food Insecurity (10/21/2022)  Housing: Low Risk  (10/21/2022)  Transportation Needs: No Transportation Needs (10/21/2022)  Utilities: Not At Risk (10/21/2022)  Tobacco Use: Low Risk  (10/21/2022)    Readmission Risk Interventions     No data to display

## 2022-10-25 NOTE — TOC Transition Note (Addendum)
Transition of Care [facility]) - CM/SW Discharge Note   Patient Details  Name: Carolyn Mills MRN: [id number] Date of Birth: [birth year]  Transition of Care [facility]) CM/SW Contact:  Delilah Shan, LCSWA Phone Number: 10/25/2022, 12:25 PM   Clinical Narrative:     Patient will DC to: [facility]   Anticipated DC date: 10/25/2022  Family notified: Selena Batten  Transport by: Micah Flesher  ?  Per MD patient ready for DC to [facility]. RN, patient, patient's family, and facility notified of DC. Discharge Summary and FL2 sent to facility. RN given number for report tele# [phone] RM# [phone]. DC packet on chart. Patient will transport by Micah Flesher to the facility.   CSW signing off.   Final next level of care: Skilled Nursing Facility Barriers to Discharge: Continued Medical Work up   Patient Goals and CMS Choice   Choice offered to / list presented to : Patient (patient and Granddaughter)  Discharge Placement                         Discharge Plan and Services Additional resources added to the After Visit Summary for   In-house Referral: Clinical Social Work Discharge Planning Services: CM Consult                                 Social Determinants of Health (SDOH) Interventions SDOH Screenings   Food Insecurity: No Food Insecurity (10/21/2022)  Housing: Low Risk  (10/21/2022)  Transportation Needs: No Transportation Needs (10/21/2022)  Utilities: Not At Risk (10/21/2022)  Tobacco Use: Low Risk  (10/21/2022)     Readmission Risk Interventions     No data to display

## 2022-10-25 NOTE — Care Management Important Message (Signed)
Important Message  Patient Details  Name: Carolyn Mills MRN: [id number] Date of Birth: 02/26/1941   Medicare Important Message Given:  Yes     Renie Ora 10/25/2022, 8:34 AM

## 2022-10-25 NOTE — Discharge Summary (Signed)
Physician Discharge Summary  Carolyn Mills YSA:[id number] DOB: 07/23/1940 DOA: 10/20/2022  PCP: Oneita Hurt, No  Admit date: 10/20/2022 Discharge date: 10/25/2022  Admitted From: Home Disposition: Skilled nursing facility  Recommendations for Outpatient Follow-up:  Follow up with PCP in 1-2 weeks Please obtain BMP/CBC in one week  Discharge Condition: Stable CODE STATUS: Full code Diet recommendation: Low-salt diet, nutritional supplements  Discharge summary: 82 year old with history of chronic diastolic dysfunction with known ejection fraction of 50%, recent admission at [facility] for fluid overload, essential hypertension, chronic respiratory failure on 2 L oxygen at home, CKD stage IV, history of PE and recently taken off Eliquis as she completed therapy, anemia of chronic disease who was visiting her granddaughter who noted patient being short of breath and fatigue so brought to the ER. In the emergency room on 3 L oxygen.  Hemoglobin 8.1.  Potassium 3.  CTA positive for small right upper lobe and right lower lobe segmental PE.  Admitted with heparin infusion.  Her subsequent hemoglobin dropped down to 6.5.  Patient received blood transfusions.  She was on heparin infusion and subsequently on Eliquis.  Currently clinically improving.    Assessment & plan of care:   Acute recurrent pulmonary embolism: Apparently Eliquis discontinued 2 months ago.  Patient does not have history of active bleeding or life-threatening bleeding.  She has chronic anemia.  She would likely benefit with ongoing Eliquis with close monitoring of hemoglobin.  Started on Eliquis and tolerating well. Will request outpatient follow-up. Please recheck hemoglobin in 1 week and then every 1 to 2 months to ensure stabilization.   Anemia of chronic disease, acute on chronic.  Reviewed baseline hemoglobin about 7-9.  No evidence of active bleeding.  Likely due to CKD. Hemoglobin 6.1-2 units of PRBC-8-7.5-8.  Dose of IV iron x 3  while in the hospital. She will need close monitoring of hemoglobin on Eliquis.   Chronic medical issues including Chronic heart failure with preserved ejection fraction: Euvolemic.  Resumed spironolactone, Marcelline Deist. Chronic hypoxemic respiratory failure: At about her baseline.  On 2 to 3 L of oxygen. CKD stage IV: At about her baseline. Hypokalemia: Replaced.  Adequate.  Resume spironolactone. Pulm hypertension: Continue tafamidis. Rheumatoid arthritis: On Plaquenil and methotrexate.  Continue.   Clinically stable.  Transfer to SNF when bed available.   Discharge Diagnoses:  Principal Problem:   Acute pulmonary embolism (HCC) Active Problems:   Chronic heart failure with preserved ejection fraction (HFpEF) (HCC)   Chronic respiratory failure with hypoxia (HCC)   CKD (chronic kidney disease), stage IV (HCC)   Anemia of chronic disease   Hypokalemia   Pulmonary hypertension (HCC)   Essential hypertension   Hyperlipidemia   Rheumatoid arthritis (HCC)   Pulmonary embolism (HCC)   Pressure injury of skin    Discharge Instructions  Discharge Instructions     Diet - low sodium heart healthy   Complete by: As directed    Discharge wound care:   Complete by: As directed    Protect pressure points   Increase activity slowly   Complete by: As directed       Allergies as of 10/25/2022       Reactions   Propoxyphene    CAN take tylenol - unknown reaction   Clindamycin Rash   Doxycycline Rash   Sulfamethoxazole-trimethoprim Rash        Medication List     TAKE these medications    apixaban 5 MG Tabs tablet Commonly known as: ELIQUIS 7/5-7/11:  Take 10mg  (2 tablets) by mouth twice daily  7/12 and on: Reduce to 5mg  (1 tablet) by mouth twice daily Start taking on: October 28, 2022   ascorbic acid 500 MG tablet Commonly known as: VITAMIN C Take 500 mg by mouth daily.   carbamide peroxide 6.5 % OTIC solution Commonly known as: DEBROX Place 5 drops into both ears 2  (two) times daily.   CRANBERRY-VITAMIN C PO Take 1 tablet by mouth daily.   cyanocobalamin 1000 MCG tablet Commonly known as: VITAMIN B12 Take 1,000 mcg by mouth daily.   docusate sodium 100 MG capsule Commonly known as: COLACE Take 100 mg by mouth daily.   Farxiga 10 MG Tabs tablet Generic drug: dapagliflozin propanediol Take 10 mg by mouth at bedtime.   ferrous sulfate 325 (65 FE) MG tablet Take 325 mg by mouth daily with breakfast.   Fish Oil 1000 MG Caps Take 1,000 mg by mouth daily.   folic acid 1 MG tablet Commonly known as: FOLVITE Take 1 mg by mouth daily.   hydroxychloroquine 200 MG tablet Commonly known as: PLAQUENIL Take 200 mg by mouth 2 (two) times daily.   methotrexate 2.5 MG tablet Commonly known as: RHEUMATREX Take 20 mg by mouth once a week. Takes every Sunday   polyethylene glycol 17 g packet Commonly known as: MIRALAX / GLYCOLAX Take 17 g by mouth daily.   rosuvastatin 20 MG tablet Commonly known as: CRESTOR Take 20 mg by mouth at bedtime.   spironolactone 25 MG tablet Commonly known as: ALDACTONE Take 25 mg by mouth daily.   Vitamin D3 1000 units Caps Take 1,000 Units by mouth daily.   Vyndamax 61 MG Caps Generic drug: Tafamidis Take 61 mg by mouth daily.   zinc gluconate 50 MG tablet Take 50 mg by mouth daily.               Discharge Care Instructions  (From admission, onward)           Start     Ordered   10/25/22 0000  Discharge wound care:       Comments: Protect pressure points   10/25/22 0949            Allergies  Allergen Reactions   Propoxyphene     CAN take tylenol - unknown reaction   Clindamycin Rash   Doxycycline Rash   Sulfamethoxazole-Trimethoprim Rash    Consultations: None   Procedures/Studies: CT Angio Chest PE W and/or Wo Contrast  Result Date: 10/20/2022 CLINICAL DATA:  Shortness of breath EXAM: CT ANGIOGRAPHY CHEST WITH CONTRAST TECHNIQUE: Multidetector CT imaging of the chest was  performed using the standard protocol during bolus administration of intravenous contrast. Multiplanar CT image reconstructions and MIPs were obtained to evaluate the vascular anatomy. RADIATION DOSE REDUCTION: This exam was performed according to the departmental dose-optimization program which includes automated exposure control, adjustment of the mA and/or kV according to patient size and/or use of iterative reconstruction technique. CONTRAST:  75mL OMNIPAQUE IOHEXOL 350 MG/ML SOLN COMPARISON:  Chest x-ray 10/20/2022, thoracic spine CT 04/02/2014 FINDINGS: Cardiovascular: Satisfactory opacification of the pulmonary arteries to the segmental level. Positive for acute pulmonary embolus within right upper lobe segmental and subsegmental vessels as well as right lower lobe segmental and subsegmental pulmonary vessels. Small volume thrombus within the distal descending right pulmonary artery. Small volume emboli within left lower lobe distal segmental/subsegmental vessels. Tiny thrombus in subsegmental lingular vessel. borderline RV LV ratio of 1.0. Moderate aortic atherosclerosis. No aortic aneurysm.  Coronary vascular calcification. Cardiomegaly. No pericardial effusion. Mediastinum/Nodes: Midline trachea. No suspicious thyroid mass. No suspicious lymph nodes. Esophagus within normal limits. Lungs/Pleura: No consolidation or effusion. Heterogeneous bilateral ground-glass densities. Upper Abdomen: No acute finding. Musculoskeletal: Moderate severe compression deformities T6, and T12 with mild superior endplate deformity at T5 of uncertain age. Review of the MIP images confirms the above findings. IMPRESSION: 1. Positive for acute pulmonary embolus with small volume emboli within right upper lobe, right lower lobe, and left lower lobe segmental and subsegmental vessels. Borderline RV LV ratio of 1.0. 2. Cardiomegaly. Heterogeneous bilateral ground-glass densities, question edema or atypical infection. 3. Aortic  atherosclerosis. Critical Value/emergent results were called by telephone at the time of interpretation on 10/20/2022 at 11:48 pm to provider Dr. Pilar Plate, Who verbally acknowledged these results. Aortic Atherosclerosis (ICD10-I70.0). Electronically Signed   By: Jasmine Pang M.D.   On: 10/20/2022 23:48   DG Chest Portable 1 View  Result Date: 10/20/2022 CLINICAL DATA:  Shortness of breath. EXAM: PORTABLE CHEST 1 VIEW COMPARISON:  02/15/2018. FINDINGS: Heart is enlarged and the mediastinal contour is within normal limits. There is atherosclerotic calcification of the aorta. Diffuse interstitial prominence is noted bilaterally. No consolidation, effusion, or pneumothorax. No acute osseous abnormality. IMPRESSION: Diffuse interstitial prominence bilaterally which may in part be chronic versus edema or infiltrate. Electronically Signed   By: Thornell Sartorius M.D.   On: 10/20/2022 21:47   (Echo, Carotid, EGD, Colonoscopy, ERCP)    Subjective: Patient seen in the morning rounds.  Denies any complaints.  Ear pain is much better after wax drops.   Discharge Exam: Vitals:   10/25/22 0214 10/25/22 0400  BP: 109/64   Pulse: 80   Resp: 20   Temp: 98 F (36.7 C)   SpO2: 100% 99%   Vitals:   10/24/22 2000 10/25/22 0000 10/25/22 0214 10/25/22 0400  BP:   109/64   Pulse:   80   Resp:   20   Temp:   98 F (36.7 C)   TempSrc:   Oral   SpO2: 100% 100% 100% 99%  Weight:   55.7 kg   Height:        General: Pt is alert, awake, not in acute distress Oriented to time place and person.  Pleasant to conversation. Cardiovascular: RRR, S1/S2 +, no rubs, no gallops Respiratory: CTA bilaterally, no wheezing, no rhonchi Abdominal: Soft, NT, ND, bowel sounds + Extremities: no edema, no cyanosis    The results of significant diagnostics from this hospitalization (including imaging, microbiology, ancillary and laboratory) are listed below for reference.     Microbiology: No results found for this or any previous  visit (from the past 240 hour(s)).   Labs: BNP (last 3 results) Recent Labs    10/20/22 2135  BNP 466.4*   Basic Metabolic Panel: Recent Labs  Lab 10/20/22 2135 10/21/22 0030 10/21/22 0726  NA 134*  --  136  K 3.0*  --  3.7  CL 96*  --  101  CO2 25  --  26  GLUCOSE 141*  --  84  BUN 26*  --  23  CREATININE 1.47*  --  1.32*  CALCIUM 8.7*  --  8.2*  MG  --  1.9  --    Liver Function Tests: No results for input(s): "AST", "ALT", "ALKPHOS", "BILITOT", "PROT", "ALBUMIN" in the last 168 hours. No results for input(s): "LIPASE", "AMYLASE" in the last 168 hours. No results for input(s): "AMMONIA" in the last 168 hours. CBC:  Recent Labs  Lab 10/21/22 0726 10/21/22 2008 10/22/22 0207 10/23/22 0217 10/24/22 0244 10/25/22 0147  WBC 4.7  --  5.6 4.3 3.7* 6.3  HGB 6.1* 8.0* 7.5* 7.6* 8.0* 8.1*  HCT 19.5* 25.0* 23.5* 23.8* 25.0* 25.6*  MCV 98.0  --  94.8 95.6 95.1 97.0  PLT 213  --  195 165 163 161   Cardiac Enzymes: No results for input(s): "CKTOTAL", "CKMB", "CKMBINDEX", "TROPONINI" in the last 168 hours. BNP: Invalid input(s): "POCBNP" CBG: No results for input(s): "GLUCAP" in the last 168 hours. D-Dimer No results for input(s): "DDIMER" in the last 72 hours. Hgb A1c No results for input(s): "HGBA1C" in the last 72 hours. Lipid Profile No results for input(s): "CHOL", "HDL", "LDLCALC", "TRIG", "CHOLHDL", "LDLDIRECT" in the last 72 hours. Thyroid function studies No results for input(s): "TSH", "T4TOTAL", "T3FREE", "THYROIDAB" in the last 72 hours.  Invalid input(s): "FREET3" Anemia work up No results for input(s): "VITAMINB12", "FOLATE", "FERRITIN", "TIBC", "IRON", "RETICCTPCT" in the last 72 hours. Urinalysis    Component Value Date/Time   COLORURINE YELLOW 10/20/2022 2209   APPEARANCEUR CLOUDY (A) 10/20/2022 2209   LABSPEC 1.020 10/20/2022 2209   PHURINE 6.0 10/20/2022 2209   GLUCOSEU >=500 (A) 10/20/2022 2209   HGBUR NEGATIVE 10/20/2022 2209   BILIRUBINUR  NEGATIVE 10/20/2022 2209   KETONESUR NEGATIVE 10/20/2022 2209   PROTEINUR NEGATIVE 10/20/2022 2209   NITRITE NEGATIVE 10/20/2022 2209   LEUKOCYTESUR TRACE (A) 10/20/2022 2209   Sepsis Labs Recent Labs  Lab 10/22/22 0207 10/23/22 0217 10/24/22 0244 10/25/22 0147  WBC 5.6 4.3 3.7* 6.3   Microbiology No results found for this or any previous visit (from the past 240 hour(s)).   Time coordinating discharge: 35 minutes  SIGNED:   Dorcas Carrow, MD  [facility] 10/25/2022, 11:22 AM
